# Patient Record
Sex: Female | Born: 1965
Health system: Southern US, Community
[De-identification: ages and names within clinical notes are randomized; demographics above are authoritative.]

## PROBLEM LIST (undated history)

## (undated) DIAGNOSIS — I10 Essential (primary) hypertension: Secondary | ICD-10-CM

## (undated) DIAGNOSIS — D649 Anemia, unspecified: Secondary | ICD-10-CM

## (undated) DIAGNOSIS — D219 Benign neoplasm of connective and other soft tissue, unspecified: Secondary | ICD-10-CM

## (undated) DIAGNOSIS — E785 Hyperlipidemia, unspecified: Secondary | ICD-10-CM

## (undated) DIAGNOSIS — N83209 Unspecified ovarian cyst, unspecified side: Secondary | ICD-10-CM

## (undated) HISTORY — PX: PELVIC LAPAROSCOPY: SHX162

## (undated) HISTORY — PX: OVARIAN CYST SURGERY: SHX726

## (undated) HISTORY — DX: Benign neoplasm of connective and other soft tissue, unspecified: D21.9

## (undated) HISTORY — DX: Anemia, unspecified: D64.9

## (undated) HISTORY — DX: Hyperlipidemia, unspecified: E78.5

## (undated) HISTORY — PX: UTERINE FIBROID SURGERY: SHX826

## (undated) HISTORY — DX: Essential (primary) hypertension: I10

## (undated) HISTORY — PX: TOOTH EXTRACTION: SUR596

## (undated) HISTORY — PX: MYOMECTOMY: SHX85

## (undated) HISTORY — DX: Unspecified ovarian cyst, unspecified side: N83.209

---

## 2000-12-01 ENCOUNTER — Other Ambulatory Visit: Admission: RE | Admit: 2000-12-01 | Discharge: 2000-12-01 | Payer: Self-pay | Admitting: Obstetrics & Gynecology

## 2001-12-23 ENCOUNTER — Other Ambulatory Visit: Admission: RE | Admit: 2001-12-23 | Discharge: 2001-12-23 | Payer: Self-pay | Admitting: Obstetrics & Gynecology

## 2002-05-13 ENCOUNTER — Encounter (INDEPENDENT_AMBULATORY_CARE_PROVIDER_SITE_OTHER): Payer: Self-pay

## 2002-05-13 ENCOUNTER — Ambulatory Visit (HOSPITAL_COMMUNITY): Admission: RE | Admit: 2002-05-13 | Discharge: 2002-05-13 | Payer: Self-pay | Admitting: Obstetrics & Gynecology

## 2003-01-02 ENCOUNTER — Other Ambulatory Visit: Admission: RE | Admit: 2003-01-02 | Discharge: 2003-01-02 | Payer: Self-pay | Admitting: *Deleted

## 2003-05-31 ENCOUNTER — Encounter: Admission: RE | Admit: 2003-05-31 | Discharge: 2003-05-31 | Payer: Self-pay | Admitting: Family Medicine

## 2004-02-06 ENCOUNTER — Inpatient Hospital Stay (HOSPITAL_COMMUNITY): Admission: RE | Admit: 2004-02-06 | Discharge: 2004-02-08 | Payer: Self-pay | Admitting: Obstetrics and Gynecology

## 2004-02-06 ENCOUNTER — Encounter (INDEPENDENT_AMBULATORY_CARE_PROVIDER_SITE_OTHER): Payer: Self-pay | Admitting: *Deleted

## 2004-03-25 ENCOUNTER — Other Ambulatory Visit: Admission: RE | Admit: 2004-03-25 | Discharge: 2004-03-25 | Payer: Self-pay | Admitting: Obstetrics and Gynecology

## 2006-02-02 ENCOUNTER — Other Ambulatory Visit: Admission: RE | Admit: 2006-02-02 | Discharge: 2006-02-02 | Payer: Self-pay | Admitting: Obstetrics and Gynecology

## 2006-03-24 ENCOUNTER — Ambulatory Visit (HOSPITAL_COMMUNITY): Admission: RE | Admit: 2006-03-24 | Discharge: 2006-03-24 | Payer: Self-pay | Admitting: Obstetrics and Gynecology

## 2007-03-29 ENCOUNTER — Ambulatory Visit (HOSPITAL_COMMUNITY): Admission: RE | Admit: 2007-03-29 | Discharge: 2007-03-29 | Payer: Self-pay | Admitting: Obstetrics and Gynecology

## 2007-04-07 ENCOUNTER — Encounter: Admission: RE | Admit: 2007-04-07 | Discharge: 2007-04-07 | Payer: Self-pay | Admitting: Obstetrics and Gynecology

## 2008-03-29 ENCOUNTER — Ambulatory Visit (HOSPITAL_COMMUNITY): Admission: RE | Admit: 2008-03-29 | Discharge: 2008-03-29 | Payer: Self-pay | Admitting: Obstetrics and Gynecology

## 2008-11-22 ENCOUNTER — Ambulatory Visit: Payer: Self-pay | Admitting: Obstetrics and Gynecology

## 2008-11-22 ENCOUNTER — Other Ambulatory Visit: Admission: RE | Admit: 2008-11-22 | Discharge: 2008-11-22 | Payer: Self-pay | Admitting: Obstetrics and Gynecology

## 2008-11-22 ENCOUNTER — Encounter: Payer: Self-pay | Admitting: Obstetrics and Gynecology

## 2008-12-25 ENCOUNTER — Ambulatory Visit: Payer: Self-pay | Admitting: Obstetrics and Gynecology

## 2009-01-05 ENCOUNTER — Ambulatory Visit: Payer: Self-pay | Admitting: Obstetrics and Gynecology

## 2009-04-12 ENCOUNTER — Ambulatory Visit (HOSPITAL_COMMUNITY): Admission: RE | Admit: 2009-04-12 | Discharge: 2009-04-12 | Payer: Self-pay | Admitting: Obstetrics and Gynecology

## 2010-01-08 ENCOUNTER — Other Ambulatory Visit: Admission: RE | Admit: 2010-01-08 | Discharge: 2010-01-08 | Payer: Self-pay | Admitting: Obstetrics and Gynecology

## 2010-01-08 ENCOUNTER — Ambulatory Visit: Payer: Self-pay | Admitting: Obstetrics and Gynecology

## 2010-03-07 ENCOUNTER — Other Ambulatory Visit: Payer: Self-pay | Admitting: Obstetrics and Gynecology

## 2010-03-07 DIAGNOSIS — Z1231 Encounter for screening mammogram for malignant neoplasm of breast: Secondary | ICD-10-CM

## 2010-03-07 DIAGNOSIS — Z0000 Encounter for general adult medical examination without abnormal findings: Secondary | ICD-10-CM

## 2010-03-09 ENCOUNTER — Encounter: Payer: Self-pay | Admitting: Obstetrics and Gynecology

## 2010-03-10 ENCOUNTER — Encounter: Payer: Self-pay | Admitting: Obstetrics and Gynecology

## 2010-04-15 ENCOUNTER — Ambulatory Visit (HOSPITAL_COMMUNITY)
Admission: RE | Admit: 2010-04-15 | Discharge: 2010-04-15 | Disposition: A | Discharge status: Home / Self Care | Payer: 59 | Source: Ambulatory Visit | Attending: Obstetrics and Gynecology | Admitting: Obstetrics and Gynecology

## 2010-04-15 ENCOUNTER — Encounter (HOSPITAL_COMMUNITY): Payer: Self-pay

## 2010-04-15 DIAGNOSIS — Z1231 Encounter for screening mammogram for malignant neoplasm of breast: Secondary | ICD-10-CM | POA: Insufficient documentation

## 2010-07-05 NOTE — Discharge Summary (Signed)
Andrea Lynn, Andrea Lynn             ACCOUNT NO.:  [id number]   MEDICAL RECORD NO.:  [id number]          PATIENT TYPE:  INP   LOCATION:  9309                          [facility]ILITY:  [facility]   PHYSICIAN:  Daniel L. Gottsegen, M.D.DATE OF BIRTH:  02/22/1968   DATE OF ADMISSION:  02/06/2004  DATE OF DISCHARGE:  02/08/2004                                 DISCHARGE SUMMARY   The patient is a 45 year old female who was admitted to the hospital with  large fibroids and anemia for definitive surgery.  On the day of admission,  she was taken to the operating room.  Multiple myomectomies and a left  ovarian cyst were   Dictation ended at this point.      DLG/MEDQ  D:  02/27/2004  T:  02/27/2004  Job:  [id number]

## 2010-07-05 NOTE — Discharge Summary (Signed)
Andrea Lynn, Andrea Lynn             ACCOUNT NO.:  [id number]   MEDICAL RECORD NO.:  [id number]          PATIENT TYPE:  INP   LOCATION:  9309                          [facility]   PHYSICIAN:  Daniel L. Gottsegen, M.D.DATE OF BIRTH:  02/22/1968   DATE OF ADMISSION:  02/06/2004  DATE OF DISCHARGE:  02/08/2004                                 DISCHARGE SUMMARY   REDICTATION   The patient is a 45 year old female who was admitted to the hospital with  large fibroids, DUB, anemia for definitive surgery.  On the day of  admission, she was taken to the operating room, multiple myomectomies were  performed as well as a left ovarian cystectomy.  Significant blood loss  occurred of about 1250 mL but postoperatively the patient seemed to be  tolerating it well.  Her hemoglobin dropped to 7.5 but once again she could  ambulate without difficulty.  She had a mild ileus postoperatively but by  the second postoperative day had responded to a Dulcolax suppository. She  was discharged on iron and will return to the office for followup care in  two weeks.  Final pathology report revealed multiple fibroids and benign  left ovarian cyst.   CONDITION ON DISCHARGE:  Improved.   DISCHARGE DIAGNOSES:  Multiple fibroids with anemia and dysfunctional  uterine bleeding, left ovarian cyst.   OPERATION:  Multiple myomectomies, left ovarian cystectomy.     Dani   DLG/MEDQ  D:  03/08/2004  T:  03/08/2004  Job:  [id number]

## 2010-07-05 NOTE — Op Note (Signed)
   NAME:  Andrea Lynn, Andrea Lynn                       ACCOUNT NO.:  [id number]   MEDICAL RECORD NO.:  [id number]                   PATIENT TYPE:  AMB   LOCATION:  SDC                                  [facility]   PHYSICIAN:  Freddy Finner, M.D.                DATE OF BIRTH:  [birth year]   DATE OF PROCEDURE:  05/13/2002  DATE OF DISCHARGE:                                 OPERATIVE REPORT   PREOPERATIVE DIAGNOSIS:  Submucous myoma.   POSTOPERATIVE DIAGNOSIS:  Submucous myoma.   OPERATIVE PROCEDURES:  1. Hysteroscopy.  2. Resection of submucous myoma.  3. Endometrial curettage.   ANESTHESIA:  General.   ESTIMATED INTRAOPERATIVE BLOOD LOSS:  Less than or equal to 20 mL.   SORBITOL DEFICIT:  20 mL.   INTRAOPERATIVE COMPLICATIONS:  None.   INDICATIONS:  The patient is a 45 year old who had dysfunctional uterine  bleeding.  She was found by sonohysterogram to have a submucous myoma.  She  is admitted now for hysterectomy and D&C.   DESCRIPTION OF PROCEDURE:  She was admitted on the morning of surgery,  brought to the operating room, placed under appropriate for gestational age,  and placed in the dorsolithotomy position using the [employer] system.  Betadine prep of the perineum and vagina was carried out in the usual  fashion.  The cervix and uterus sounded to 10 cm.  The cervix was  progressively dilated to 23 with Pratts.  The HEMI 12.5 degree hysteroscope  was introduced using 3% Sorbitol as the distended median and inspection  identified the submucous myoma.  The cervix was then further dilated to 35  with Pratts.  Ring forceps were introduced and grasped and the myoma  resected without difficulty.  Gentle thorough curettage was then done and a  separate sample submitted.  Reinspection confirmed absence of the myoma.  The procedure was terminated.  The instruments were removed.  The patient  was awaken and taken to recovery in good condition.  She will be discharged  in  the immediate postoperative period for follow-up in the office in  approximately 7-10 days.                                               Freddy Finner, M.D.    WRN/MEDQ  D:  05/13/2002  T:  05/13/2002  Job:  [id number]

## 2010-07-05 NOTE — Op Note (Signed)
Andrea Lynn, Andrea Lynn             ACCOUNT NO.:  [id number]   MEDICAL RECORD NO.:  [id number]          PATIENT TYPE:  INP   LOCATION:  9399                          [facility]   PHYSICIAN:  Daniel L. Gottsegen, M.D.DATE OF BIRTH:  02/22/1968   DATE OF PROCEDURE:  02/06/2004  DATE OF DISCHARGE:                                 OPERATIVE REPORT   PREOPERATIVE DIAGNOSIS:  Multiple fibroid with dysfunctional uterine  bleeding and anemia.   POSTOPERATIVE DIAGNOSES:  1.  Multiple fibroid with dysfunctional uterine bleeding and anemia.  2.  Left ovarian cyst.  3.  Pelvic adhesions.   OPERATION:  1.  Multiple myomectomies.  2.  Left ovarian cystectomy.  3.  Lysis of adhesions.   SURGEON:  Daniel L. Eda Paschal, M.D.   FIRST ASSISTANT:  Juan H. Lily Peer, M.D.   FINDINGS:  At the time of surgery, the patient had eight myomas.  They were  of varying size, anywhere from 1 cm all the way up to 6 cm.  Several of the  big ones were close to the endometrial cavity, one was encroaching on it,  and one actually had entered the endometrial cavity although it was not  confined to the endometrial cavity.  Both fallopian tubes had luxuriant  fimbriae.  The left fallopian tube was normal.  The right had some adhesive  disease.  The right ovary was densely buried in adhesions and could not  actually even be seen.  The left ovary was enlarged by an ovarian cyst of  about 3 cm that appeared to be a functional cyst once it had been excised.   PROCEDURE:  After adequate general endotracheal anesthesia, the patient was  placed in the supine position, prepped and draped in the usual sterile  manner.  A Foley catheter was inserted into the bladder.  A pediatric Foley  was inserted into the uterus.  A Pfannenstiel incision was made.  The fascia  was opened transversely.  The peritoneum was entered vertically.  Subcutaneous bleeders were clamped and bovied as encountered.  When the  peritoneal cavity  was opened, the above findings were noted.  A dilute  solution of Pitressin was injected in the serosa of the uterus.  A midline  vertical incision was made in the uterus, through which all myomectomies  were performed.  First a very large one was removed from the anterior wall,  then a second large one was removed from the anterior wall.  During removing  this, the endometrial cavity was entered.  At least half of the myoma was in  the endometrial cavity.  The rest of the myomas were removed without  difficulty.  Bleeding was brisk, and blood loss was felt to be over 1000 mL,  but the patient was tolerating this well.  Dye could not be injected at the  termination of the procedure because the endometrial cavity was entered so  early in the procedure.  Once all myomas were removed, the endometrial  cavity was closed with 4-0 Vicryl.  The myometrium was closed with  interrupteds of 0 Vicryl and the serosa of the  uterus was closed with 2-0  Prolene.  Attention was next turned to the adnexa.  The right tube was  somewhat kinked by adhesions.  Each of these were lysed, making the tube  have a more normal course.  Because of the brisk blood that had been lost  before, it was not felt appropriate to try to uncover the right ovary.  It  would have taken significant dissection to do so.  Both surgeons felt that  there was a possibility to even lose the ovary and in light of all the blood  loss already was not felt to be appropriate at this point.  On the left  side, however, adhesions were lysed.  The left ovarian cyst was excised and  sent to pathology for tissue diagnosis.  The ovary was closed with a running  3-0 Prolene and the patient appeared to have a fairly normal left adnexa.  At the termination of the procedure, there was no bleeding noted.  Copious  irrigation was done with Ringer's lactate.  All fluid was removed.  Interceed was placed around the left adnexa and over the uterine  incisional  site.  The peritoneum was closed with a running 0 Vicryl.  The subfascial  and superficial spaces were copiously irrigated with sterile saline.  The  fascia was closed with two running 0 Vicryl and the skin was closed with 3-0  plain subcuticular suture and Steri-Strips.  Estimated blood loss was 1250  mL with none replaced.  Hemoglobin done at the termination of the procedure  was 7.9.  The patient appeared to tolerate the procedure well and left the  operating room in satisfactory condition draining clear urine from her Foley  catheter.     Dani   DLG/MEDQ  D:  02/06/2004  T:  02/06/2004  Job:  [id number]

## 2010-07-05 NOTE — H&P (Signed)
NAMEJERRELL, Andrea Lynn             ACCOUNT NO.:  [id number]   MEDICAL RECORD NO.:  [id number]          PATIENT TYPE:  INP   LOCATION:  NA                            [facility]   PHYSICIAN:  Daniel L. Gottsegen, M.D.DATE OF BIRTH:  23-Dec-1965   DATE OF ADMISSION:  DATE OF DISCHARGE:                                HISTORY & PHYSICAL   CHIEF COMPLAINT:  Symptomatic fibroids.   HISTORY OF PRESENT ILLNESS:  The patient is a 45 year old gravida 1, para 0,  Ab1, who has had persistent menometrorrhagia.  Ultrasound reveals multiple  large fibroids; the largest one is intramural and encroaches on the  endometrial cavity and it is 5.5 cm.  She also has a 3.5-cm one, a 2.5-cm  one, a 2.0-cm one and 2 less than 2.0 cm.  The patient is interested in  conceiving and therefore would like to proceed with conservative surgery;  she therefore enters the hospital for myomectomy for treatment of the above.  She understands that there is some small but real risk of hysterectomy in  doing a myomectomy.   PAST MEDICAL HISTORY:  The patient had an exploratory laparotomy done by Dr.  Tinnie Gens L. Deaton at [facility] because of concern that she had pelvic  pathology, however, no pelvic pathology was found; this was in 1998.  She  also had a hysteroscopic myomectomy done in 2004 by Dr. Lacretia Nicks. Varney Baas for a  submucous myoma.   PRESENT MEDICATIONS:  Iron.   ALLERGIES:  She is allergic to no drugs.   FAMILY HISTORY:  Family history is completely noncontributory.   SOCIAL HISTORY:  She is a nonsmoker, nondrinker.  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   REVIEW OF SYSTEMS:  HEENT:  Negative.  CARDIAC:  Negative.  RESPIRATORY:  Negative.  GI:  Negative.  GU:  Negative.  NEUROMUSCULAR:  Negative.  ENDOCRINE:  Negative.   PHYSICAL EXAMINATION:  GENERAL:  The patient is a well-developed, well-  nourished female in no acute distress.  VITAL SIGNS:  Blood pressure is 140/85.  Pulse is 80 and  regular.  Respirations are 16 and nonlabored.  She is afebrile.  HEENT:  Within normal limits.  NECK:  Neck is supple.  Trachea in the midline.  Thyroid not enlarged.  LUNGS:  Lungs are clear to P&A.  HEART:  No thrills, heaves or murmurs.  BREASTS:  No masses.  ABDOMEN:  Abdomen is soft without guarding, rebound or masses.  PELVIC:  External and vaginal are normal.  Cervix is clear.  Pap smear shows  no atypia.  Uterus is enlarged by multiple myomas to 10 weeks' size.  Adnexa  are palpably normal.  RECTAL:  Rectal is confirmatory.  EXTREMITIES:  Extremities are within normal limits.   ADMISSION IMPRESSION:  Multiple fibroids with menometrorrhagia.   PLAN:  Multiple myomectomies.     Dani   DLG/MEDQ  D:  02/05/2004  T:  02/05/2004  Job:  [id number]

## 2011-03-13 ENCOUNTER — Other Ambulatory Visit: Payer: Self-pay | Admitting: Obstetrics and Gynecology

## 2011-03-13 DIAGNOSIS — Z1231 Encounter for screening mammogram for malignant neoplasm of breast: Secondary | ICD-10-CM

## 2011-04-17 ENCOUNTER — Encounter: Payer: Self-pay | Admitting: Gynecology

## 2011-04-17 ENCOUNTER — Ambulatory Visit (HOSPITAL_COMMUNITY)
Admission: RE | Admit: 2011-04-17 | Discharge: 2011-04-17 | Disposition: A | Discharge status: Home / Self Care | Payer: 59 | Source: Ambulatory Visit | Attending: Obstetrics and Gynecology | Admitting: Obstetrics and Gynecology

## 2011-04-17 DIAGNOSIS — N83209 Unspecified ovarian cyst, unspecified side: Secondary | ICD-10-CM | POA: Insufficient documentation

## 2011-04-17 DIAGNOSIS — D219 Benign neoplasm of connective and other soft tissue, unspecified: Secondary | ICD-10-CM | POA: Insufficient documentation

## 2011-04-17 DIAGNOSIS — Z1231 Encounter for screening mammogram for malignant neoplasm of breast: Secondary | ICD-10-CM | POA: Insufficient documentation

## 2011-04-24 ENCOUNTER — Encounter: Payer: 59 | Admitting: Obstetrics and Gynecology

## 2011-04-25 ENCOUNTER — Other Ambulatory Visit (HOSPITAL_COMMUNITY)
Admission: RE | Admit: 2011-04-25 | Discharge: 2011-04-25 | Disposition: A | Discharge status: Home / Self Care | Payer: 59 | Source: Ambulatory Visit | Attending: Obstetrics and Gynecology | Admitting: Obstetrics and Gynecology

## 2011-04-25 ENCOUNTER — Ambulatory Visit (INDEPENDENT_AMBULATORY_CARE_PROVIDER_SITE_OTHER): Payer: 59 | Admitting: Obstetrics and Gynecology

## 2011-04-25 ENCOUNTER — Encounter: Payer: Self-pay | Admitting: Obstetrics and Gynecology

## 2011-04-25 VITALS — BP 140/84 | Ht 66.25 in | Wt 208.0 lb

## 2011-04-25 DIAGNOSIS — Z01419 Encounter for gynecological examination (general) (routine) without abnormal findings: Secondary | ICD-10-CM | POA: Insufficient documentation

## 2011-04-25 NOTE — Progress Notes (Signed)
Patient came to see me today for her annual GYN exam. She has stopped her HRT. She still has occasional hot flashes but she feels she's fine without it. She just a normal mammogram. She does her bone densities and her lab to her PCP. 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 She is having some trouble with weight gain after menopause and will work on diet and exercise. She gets frequent bilateral leg cramps especially at night. She will make sure her potassium was normal when it was checked by her PCP. She is having no vaginal bleeding or pelvic pain.  Physical examination: Andrea Lynn present. HEENT within normal limits. Neck: Thyroid not large. No masses. Supraclavicular nodes: not enlarged. Breasts: Examined in both sitting midline position. No skin changes and no masses. Abdomen: Soft no guarding rebound or masses or hernia. Pelvic: External: Within normal limits. BUS: Within normal limits. Vaginal:within normal limits. Good estrogen effect. No evidence of cystocele rectocele or enterocele. Cervix: clean. Uterus: Normal size and shape. Adnexa: No masses. Rectovaginal exam: Confirmatory and negative. Extremities: Within normal limits. No swelling, varicosities or evidence of DVT.  Assessment: Normal GYN exam  Plan: Continue yearly mammograms. Patient to discuss leg  cramps with her PCP.

## 2012-03-18 ENCOUNTER — Other Ambulatory Visit: Payer: Self-pay | Admitting: Pulmonary Disease

## 2012-03-18 DIAGNOSIS — Z1231 Encounter for screening mammogram for malignant neoplasm of breast: Secondary | ICD-10-CM

## 2012-04-19 ENCOUNTER — Ambulatory Visit (HOSPITAL_COMMUNITY)
Admission: RE | Admit: 2012-04-19 | Discharge: 2012-04-19 | Disposition: A | Discharge status: Home / Self Care | Payer: 59 | Source: Ambulatory Visit | Attending: Pulmonary Disease | Admitting: Pulmonary Disease

## 2012-04-19 DIAGNOSIS — Z1231 Encounter for screening mammogram for malignant neoplasm of breast: Secondary | ICD-10-CM | POA: Insufficient documentation

## 2012-04-26 ENCOUNTER — Encounter: Payer: 59 | Admitting: Women's Health

## 2012-05-03 ENCOUNTER — Encounter: Payer: Self-pay | Admitting: Women's Health

## 2012-05-03 ENCOUNTER — Ambulatory Visit: Payer: 59 | Admitting: Women's Health

## 2012-05-03 VITALS — BP 116/76 | Ht 66.25 in | Wt 198.0 lb

## 2012-05-03 DIAGNOSIS — Z01419 Encounter for gynecological examination (general) (routine) without abnormal findings: Secondary | ICD-10-CM

## 2012-05-03 NOTE — Progress Notes (Signed)
Andrea Lynn 02/22/1968 [id number]    History:    The patient presents for annual exam.  Postmenopausal on no HRT/no bleeding. History of normal Paps and mammograms. Primary care labs. History of infertility, failed IVF. Myomectomy. Dexa at [facility].   Past medical history, past surgical history, family history and social history were all reviewed and documented in the EPIC chart. [place]. Trying to adopt sisters 2 youngest children from [place]. Works in NICU.   ROS:  A  ROS was performed and pertinent positives and negatives are included in the history.  Exam:  Filed Vitals:   05/03/12 1100  BP: 116/76    General appearance:  Normal Head/Neck:  Normal, without cervical or supraclavicular adenopathy. Thyroid:  Symmetrical, normal in size, without palpable masses or nodularity. Respiratory  Effort:  Normal  Auscultation:  Clear without wheezing or rhonchi Cardiovascular  Auscultation:  Regular rate, without rubs, murmurs or gallops  Edema/varicosities:  Not grossly evident Abdominal  Soft,nontender, without masses, guarding or rebound.  Liver/spleen:  No organomegaly noted  Hernia:  None appreciated  Skin  Inspection:  Grossly normal  Palpation:  Grossly normal Neurologic/psychiatric  Orientation:  Normal with appropriate conversation.  Mood/affect:  Normal  Genitourinary    Breasts: Examined lying and sitting.     Right: Without masses, retractions, discharge or axillary adenopathy.     Left: Without masses, retractions, discharge or axillary adenopathy.   Inguinal/mons:  Normal without inguinal adenopathy  External genitalia: female circumcision  BUS/Urethra/Skene's glands:  Normal  Bladder:  Normal  Vagina:  Normal  Cervix:  Normal  Uterus:  Fibroid,  shape and contour.  Midline and mobile  Adnexa/parametria:     Rt: Without masses or tenderness.   Lt: Without masses or tenderness.  Anus and perineum: Normal  Digital rectal exam: Normal  sphincter tone without palpated masses or tenderness  Assessment/Plan:  47 y.o. MBF G0 for annual exam.    Normal postmenopausal exam/no HRT/no bleeding Fibroid uterus  Plan: SBE's, continue annual mammogram, calcium rich diet, vitamin D 2000 daily encouraged. Labs at primary care. Normal Pap 04/2011. New screening guidelines reviewed. (Requested letter written in regards to adopting 2 nieces from [place].)    Harrington Challenger [facility], 11:35 AM 05/03/2012

## 2012-05-03 NOTE — Patient Instructions (Addendum)

## 2012-06-02 ENCOUNTER — Encounter: Payer: Self-pay | Admitting: Obstetrics and Gynecology

## 2013-03-16 ENCOUNTER — Other Ambulatory Visit (HOSPITAL_COMMUNITY): Payer: Self-pay | Admitting: Internal Medicine

## 2013-03-16 DIAGNOSIS — Z1231 Encounter for screening mammogram for malignant neoplasm of breast: Secondary | ICD-10-CM

## 2013-04-13 ENCOUNTER — Emergency Department (HOSPITAL_COMMUNITY)
Admission: EM | Admit: 2013-04-13 | Discharge: 2013-04-13 | Disposition: A | Discharge status: Home / Self Care | Payer: 59 | Attending: Emergency Medicine | Admitting: Emergency Medicine

## 2013-04-13 ENCOUNTER — Encounter (HOSPITAL_COMMUNITY): Payer: Self-pay | Admitting: Emergency Medicine

## 2013-04-13 DIAGNOSIS — Z79899 Other long term (current) drug therapy: Secondary | ICD-10-CM | POA: Insufficient documentation

## 2013-04-13 DIAGNOSIS — Y92410 Unspecified street and highway as the place of occurrence of the external cause: Secondary | ICD-10-CM | POA: Insufficient documentation

## 2013-04-13 DIAGNOSIS — Z8742 Personal history of other diseases of the female genital tract: Secondary | ICD-10-CM | POA: Insufficient documentation

## 2013-04-13 DIAGNOSIS — Y9389 Activity, other specified: Secondary | ICD-10-CM | POA: Insufficient documentation

## 2013-04-13 DIAGNOSIS — S20219A Contusion of unspecified front wall of thorax, initial encounter: Secondary | ICD-10-CM | POA: Insufficient documentation

## 2013-04-13 MED ORDER — IBUPROFEN 200 MG PO TABS
600.0000 mg | ORAL_TABLET | Freq: Once | ORAL | Status: AC
  Administered 2013-04-13: 600 mg via ORAL
  Filled 2013-04-13 (×2): qty 1
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
## 2013-04-14 ENCOUNTER — Encounter: Payer: Self-pay | Admitting: Women's Health
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
## 2013-04-21 ENCOUNTER — Ambulatory Visit (HOSPITAL_COMMUNITY): Payer: 59

## 2013-05-04 ENCOUNTER — Encounter: Payer: 59 | Admitting: Women's Health

## 2013-06-01 ENCOUNTER — Ambulatory Visit (HOSPITAL_COMMUNITY)
Admission: RE | Admit: 2013-06-01 | Discharge: 2013-06-01 | Disposition: A | Discharge status: Home / Self Care | Payer: 59 | Source: Ambulatory Visit | Attending: Internal Medicine | Admitting: Internal Medicine

## 2013-06-01 DIAGNOSIS — Z1231 Encounter for screening mammogram for malignant neoplasm of breast: Secondary | ICD-10-CM | POA: Insufficient documentation

## 2013-06-23 ENCOUNTER — Encounter: Payer: Self-pay | Admitting: Women's Health

## 2013-06-23 ENCOUNTER — Ambulatory Visit (INDEPENDENT_AMBULATORY_CARE_PROVIDER_SITE_OTHER): Payer: 59 | Admitting: Women's Health

## 2013-06-23 VITALS — BP 128/84 | Ht 66.0 in | Wt 208.0 lb

## 2013-06-23 DIAGNOSIS — Z01419 Encounter for gynecological examination (general) (routine) without abnormal findings: Secondary | ICD-10-CM

## 2013-06-23 NOTE — Progress Notes (Signed)
Andrea Lynn 02/22/1968 [id number]    History:    Presents for annual exam.  Postmenopausal/no HRT/no bleeding. Normal Pap and mammogram history. Infertility. failed IVF. Fibroid uterus, several 1-2 cm each. Labs and DEXA/normal at primary care.  Past medical history, past surgical history, family history and social history were all reviewed and documented in the EPIC chart. Works in Therapist, music. Numerous family members with hypertension. Parents and family in [place].  ROS:  A  12 point ROS was performed and pertinent positives and negatives are included.  Exam:  Filed Vitals:   06/23/13 1558  BP: 128/84    General appearance:  Normal Thyroid:  Symmetrical, normal in size, without palpable masses or nodularity. Respiratory  Auscultation:  Clear without wheezing or rhonchi Cardiovascular  Auscultation:  Regular rate, without rubs, murmurs or gallops  Edema/varicosities:  Not grossly evident Abdominal  Soft,nontender, without masses, guarding or rebound.  Liver/spleen:  No organomegaly noted  Hernia:  None appreciated  Skin  Inspection:  Grossly normal   Breasts: Examined lying and sitting.     Right: Without masses, retractions, discharge or axillary adenopathy.     Left: Without masses, retractions, discharge or axillary adenopathy. Gentitourinary   Inguinal/mons:  Normal without inguinal adenopathy  External genitalia:  Female circumcision  BUS/Urethra/Skene's glands:  Normal  Vagina:  Normal  Cervix:  Normal  Uterus:   normal in size, shape and contour.  Midline and mobile  Adnexa/parametria:     Rt: Without masses or tenderness.   Lt: Without masses or tenderness.  Anus and perineum: Normal  Digital rectal exam: Normal sphincter tone without palpated masses or tenderness  Assessment/Plan:  48 y.o.MBF G0  for annual exam.   Postmenopausal/no HRT/no bleeding Labs primary care Obesity  Plan: SBE's, continue annual mammogram, calcium rich diet, vitamin D 2000 daily,  decrease calories for weight loss encouraged. Pap normal 2013, new screening guidelines reviewed.    [facility] [place], 4:30 PM 06/23/2013

## 2013-12-02 ENCOUNTER — Other Ambulatory Visit: Payer: Self-pay

## 2013-12-19 ENCOUNTER — Encounter: Payer: Self-pay | Admitting: Women's Health

## 2013-12-27 ENCOUNTER — Encounter: Payer: Self-pay | Admitting: Sports Medicine

## 2013-12-27 ENCOUNTER — Ambulatory Visit (INDEPENDENT_AMBULATORY_CARE_PROVIDER_SITE_OTHER): Payer: 59 | Admitting: Sports Medicine

## 2013-12-27 VITALS — BP 151/98 | Ht 67.0 in | Wt 192.0 lb

## 2013-12-27 DIAGNOSIS — M6248 Contracture of muscle, other site: Secondary | ICD-10-CM

## 2013-12-27 DIAGNOSIS — G2589 Other specified extrapyramidal and movement disorders: Secondary | ICD-10-CM

## 2013-12-27 DIAGNOSIS — M62838 Other muscle spasm: Secondary | ICD-10-CM

## 2013-12-27 MED ORDER — CYCLOBENZAPRINE HCL 5 MG PO TABS: 5.0000 mg | ORAL_TABLET | Freq: Three times a day (TID) | ORAL | Status: DC | PRN

## 2013-12-27 NOTE — Progress Notes (Signed)
   Subjective:    Patient ID: Andrea Lynn, female    DOB: Apr 21, 1965, 48 y.o.   MRN: [id number]  HPI Andrea Lynn is a 48 year old right-hand-dominant female who presents with left shoulder pain. Onset was about 6 months ago without any known acute injury. She works as a English as a second language teacher. Location of pain is primarily medial left scapular with some radiation throughout the scapula. She denies any neck pain or radiation of pain down her arms. Pulling items aggravates her symptoms. She tried topical Voltaren, which helps some. She denies any associated weakness, numbness, or tingling. Character of pain is a pulling sensation.  Past medical history, social history, medications, and allergies were reviewed and are up to date in the chart.  Review of Systems 7 point review of systems was performed and was otherwise negative unless noted in the history of present illness.     Objective:   Physical Exam BP 151/98 mmHg  Ht 5\' 7"  (1.702 m)  Wt 192 lb (87.091 kg)  BMI 30.06 kg/m2  LMP 04/07/2010 GEN: The patient is well-developed well-nourished female and in no acute distress.  She is awake alert and oriented x3. SKIN: warm and well-perfused, no rash  Neuro: Strength 5/5 globally. Sensation intact throughout. DTRs 2/4 bilaterally. No focal deficits. Vasc: +2 bilateral distal pulses. No edema.  MSK: examination of the left shoulder reveals full pain-free range of motion in flexion and extension. She has intact rotator cuff strength and no impingement signs. No a.c. Joint tenderness. She has tenderness to palpation over the medial left scapular rhomboid muscles. She has some mild scapular winging with arm abduction. I do feel mild pop as she abducts her arm over her scapula. No overlying erythema or induration.     Assessment & Plan:  Please see problem based assessment and plan in the problem list.

## 2013-12-27 NOTE — Patient Instructions (Signed)
-  Follow-up in 6 weeks if needed

## 2013-12-27 NOTE — Assessment & Plan Note (Signed)
-  Rx cyclobenzaprine with precautions -Heating pad, exercises, stretching -Rx formal PT for scapular exercises -Follow-up if needed

## 2013-12-29 ENCOUNTER — Telehealth: Payer: Self-pay | Admitting: *Deleted

## 2013-12-29 NOTE — Telephone Encounter (Signed)
Pt called to schedule her eval she requested an late appt. gve appt for 11.30.2015 @ 3:45p. Pt is aware that i will mail forms for her appt...td

## 2014-01-16 ENCOUNTER — Telehealth: Payer: Self-pay | Admitting: *Deleted

## 2014-01-16 ENCOUNTER — Ambulatory Visit: Payer: 59 | Attending: Sports Medicine | Admitting: Physical Therapy

## 2014-01-16 DIAGNOSIS — G2589 Other specified extrapyramidal and movement disorders: Secondary | ICD-10-CM

## 2014-01-16 DIAGNOSIS — M62838 Other muscle spasm: Secondary | ICD-10-CM | POA: Insufficient documentation

## 2014-01-16 DIAGNOSIS — G8929 Other chronic pain: Secondary | ICD-10-CM | POA: Insufficient documentation

## 2014-01-16 DIAGNOSIS — M25512 Pain in left shoulder: Secondary | ICD-10-CM | POA: Diagnosis not present

## 2014-01-16 DIAGNOSIS — G249 Dystonia, unspecified: Secondary | ICD-10-CM | POA: Insufficient documentation

## 2014-01-16 NOTE — Telephone Encounter (Signed)
appts made and printed...td 

## 2014-01-16 NOTE — Patient Instructions (Signed)
Lateral Shoulder Stretch   Bend left arm so elbow points straight ahead. Hold elbow with other hand and slowly pull toward body. A slight stretch should be felt behind shoulder and into back. Hold _30___ seconds. Repeat with other arm. Repeat __3__ times. Do _2-3___ sessions per day.  http://gt2.exer.us/223   Copyright  VHI. All rights reserved.   Scapular Retraction (Standing)   With arms at sides, pinch shoulder blades together. Repeat __10__ times per set. Do _1___ sets per session. Do _1-2__ sessions per day.  http://orth.exer.us/945   Copyright  VHI. All rights reserved.   Laureen Abrahams, PT, DPT 01/16/2014 4:15 PM [address] [phone] (office) [phone] (fax)

## 2014-01-16 NOTE — Therapy (Signed)
Physical Therapy Evaluation  Patient Details  Name: Andrea Lynn MRN: [id number] Date of Birth: Dec 25, 1965  Encounter Date: 01/16/2014      PT End of Session - 01/16/14 1624    Visit Number 1   Number of Visits 6   PT Start Time 6948   PT Stop Time 1624   PT Time Calculation (min) 39 min   Activity Tolerance Patient tolerated treatment well   Behavior During Therapy [facility] for tasks assessed/performed      Past Medical History  Diagnosis Date  . Fibroid   . Ovarian cyst     Past Surgical History  Procedure Laterality Date  . Pelvic laparoscopy      EXPL. LAP  . Ovarian cyst surgery    . Tooth extraction    . Myomectomy      LMP 04/07/2010  Visit Diagnosis:  Trapezius muscle spasm - Plan: PT plan of care cert/re-cert  Scapular dyskinesis - Plan: PT plan of care cert/re-cert  Left shoulder pain - Plan: PT plan of care cert/re-cert      Subjective Assessment - 01/16/14 1549    Symptoms Pt is a 48 y/o female who presents to OPPT for L shoulder pain exacerbated with rain/cold weather.  Pt reports symptoms x 8 months   Limitations --  pulling   Patient Stated Goals improve pain with functional activities   Currently in Pain? Yes   Pain Score 7    Pain Location Shoulder   Pain Orientation Left   Pain Descriptors / Indicators Aching   Pain Type Chronic pain   Pain Onset More than a month ago   Pain Frequency Intermittent   Aggravating Factors  pulling, using UE, weather   Pain Relieving Factors medication, rest   Multiple Pain Sites No          [facility] PT Assessment - 01/16/14 1553    Assessment   Medical Diagnosis L trapezius muscle spasm; L scapular dyskinesis   Onset Date 05/16/13   Next MD Visit PRN   Prior Therapy n/a   Precautions   Precautions None   Restrictions   Weight Bearing Restrictions No   Balance Screen   Has the patient fallen in the past 6 months No   Has the patient had a decrease in activity level because of a fear of falling?   No   Is the patient reluctant to leave their home because of a fear of falling?  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   Cognition   Overall Cognitive Status Within Functional Limits for tasks assessed   Observation/Other Assessments   Observations tender to palpation along upper trap and supraspinatus muscle L shoulder; mild scapular winging on L   Focus on Therapeutic Outcomes (FOTO)  43 (57% limited)  predicted 21% improvement to 36% limited   Posture/Postural Control   Posture/Postural Control Postural limitations   Postural Limitations Rounded Shoulders;Forward head;Increased thoracic kyphosis   AROM  Overall AROM Comments L shoulder ROM WNL however pain at end range with flexion and abduction   Strength   Right Shoulder Flexion 5/5   Right Shoulder ABduction 5/5   Right Shoulder Internal Rotation 5/5   Right Shoulder External Rotation 5/5   Left Shoulder Flexion 3+/5  pain with resistance   Left Shoulder ABduction 3+/5  pain with resistance   Left Shoulder Internal Rotation 5/5   Left Shoulder External Rotation 4/5  pain with resistance   Right Elbow Flexion 5/5   Right Elbow Extension 5/5   Left Elbow Flexion 5/5   Left Elbow Extension 4/5  pain with resistance   Grip (lbs) 54.3   Grip (lbs) 41.3   Special Tests    Special Tests Rotator Cuff Impingement   Rotator Cuff Impingment tests Empty Can test   Empty Can test   Findings Positive   Side Left           [facility] Adult PT Treatment/Exercise - 01/16/14 1553    Shoulder Exercises: Seated   Retraction Strengthening;Both;10 reps   Modalities   Modalities Moist Heat   Moist Heat Therapy   Number Minutes Moist Heat 10 Minutes   Moist Heat Location Shoulder  Left   Shoulder Exercises: Stretch   Cross Chest Stretch 3 reps;30 seconds          PT Education - 01/16/14 1624    Education provided Yes   Education Details HEP   Person(s) Educated Patient   Methods Explanation;Demonstration;Verbal cues;Handout   Comprehension Verbalized understanding;Returned demonstration;Need further instruction            PT Long Term Goals - 01/16/14 1631    PT LONG TERM GOAL #1   Title independent with HEP (02/27/14)   Time 6   Period Weeks   Status New   PT LONG TERM GOAL #2   Title perform ROM pain free (02/27/14)   Time 6   Period Weeks   Status New   PT LONG TERM GOAL #3   Title verbalize understanding on posture/body mechanics to reduce risk of reinjury (02/27/14)   Time 6   Period Weeks   Status New   PT LONG TERM GOAL #4   Title report pain < 4/10 with activity (02/27/14)   Time 6   Period Weeks   Status New          Plan - 01/16/14 1628    Clinical Impression Statement Pt presents to OPPT with L shoulder pain affecting strength and functional activities.  Will benefit from PT to maximize functional mobility and decrease pain   Pt will benefit from skilled therapeutic intervention in order to improve on the following deficits Improper body mechanics;Postural dysfunction;Decreased activity tolerance;Pain;Decreased strength   Rehab Potential Good   PT Frequency 1x / week   PT Duration 6 weeks   PT Treatment/Interventions ADLs/Self Care Home Management;Moist Heat;Therapeutic activities;Patient/family education;Therapeutic exercise;Ultrasound;Manual techniques;Dry needling;Neuromuscular re-education;Cryotherapy;Electrical Stimulation;Functional mobility training   PT Next  Visit Plan review HEP; modalities as needed; ?ionto if indicated and certification order signed   Consulted and Agree with Plan of Care Patient        Problem List Patient Active Problem List   Diagnosis Date Noted  . Scapular dyskinesis 12/27/2013  . Trapezius muscle spasm 12/27/2013  . Fibroid  Laureen Abrahams, PT, DPT 01/16/2014 4:39 PM [address] [phone] (office) [phone] (fax)

## 2014-01-24 ENCOUNTER — Ambulatory Visit: Payer: 59 | Attending: Sports Medicine | Admitting: Physical Therapy

## 2014-01-24 DIAGNOSIS — G8929 Other chronic pain: Secondary | ICD-10-CM | POA: Diagnosis not present

## 2014-01-24 DIAGNOSIS — M25512 Pain in left shoulder: Secondary | ICD-10-CM | POA: Insufficient documentation

## 2014-01-24 DIAGNOSIS — G249 Dystonia, unspecified: Secondary | ICD-10-CM | POA: Insufficient documentation

## 2014-01-24 DIAGNOSIS — G2589 Other specified extrapyramidal and movement disorders: Secondary | ICD-10-CM

## 2014-01-24 DIAGNOSIS — M62838 Other muscle spasm: Secondary | ICD-10-CM | POA: Insufficient documentation

## 2014-01-24 NOTE — Patient Instructions (Signed)

## 2014-01-24 NOTE — Therapy (Signed)
Outpatient Rehabilitation [facility] [address] [place], [place], [address] Phone: [phone]   Fax:  [phone]  Physical Therapy Treatment  Patient Details  Name: Andrea Lynn MRN: [id number] Date of Birth: 04-07-1965  Encounter Date: 01/24/2014      PT End of Session - 01/24/14 1627    Visit Number 2   Number of Visits 6   Date for PT Re-Evaluation 02/27/14   PT Start Time 1544   PT Stop Time 1622   PT Time Calculation (min) 38 min   Activity Tolerance Patient tolerated treatment well   Behavior During Therapy [facility] for tasks assessed/performed      Past Medical History  Diagnosis Date  . Fibroid   . Ovarian cyst     Past Surgical History  Procedure Laterality Date  . Pelvic laparoscopy      EXPL. LAP  . Ovarian cyst surgery    . Tooth extraction    . Myomectomy      LMP 04/07/2010  Visit Diagnosis:  Trapezius muscle spasm  Scapular dyskinesis  Left shoulder pain      Subjective Assessment - 01/24/14 1547    Symptoms L shoulder feels about the same; compliant with HEP and went to [employer] 2 days last week.   Patient Stated Goals improve pain with functional activities   Currently in Pain? Yes   Pain Score 6    Pain Location Shoulder   Pain Orientation Left   Pain Descriptors / Indicators Aching   Pain Type Chronic pain   Pain Onset More than a month ago   Pain Frequency Intermittent   Aggravating Factors  pulling, using UE, weather   Pain Relieving Factors medication, rest            [facility] Adult PT Treatment/Exercise - 01/24/14 1549    Exercises   Exercises Shoulder   Shoulder Exercises: Seated   Retraction Strengthening;Both;10 reps   Modalities   Modalities Iontophoresis   Iontophoresis   Type of Iontophoresis Dexamethasone   Location L rhomboids/paraspinals   Dose 2.0 cc   Time 8 min in clinic with patch to go   Manual Therapy   Manual Therapy Massage;Myofascial release;Scapular mobilization   Massage STM to L  rhomboids/paraspinals   Myofascial Release trigger point release to L rhomboids/paraspinal   Shoulder Exercises: ROM/Strengthening   UBE (Upper Arm Bike) Level 2.0 x 5 min, 2 1/2 min forward, 2 1/2 min backwards   Shoulder Exercises: Stretch   Cross Chest Stretch 3 reps;30 seconds          PT Education - 01/24/14 1626    Education provided Yes   Education Details Iontophoresis   Person(s) Educated Patient   Methods Explanation;Handout   Comprehension Verbalized understanding            PT Long Term Goals - 01/24/14 1629    PT LONG TERM GOAL #1   Title independent with HEP (02/27/14)   Time 6   Period Weeks   Status On-going   PT LONG TERM GOAL #2   Title perform ROM pain free (02/27/14)   Time 6   Period Weeks   Status On-going   PT LONG TERM GOAL #3   Title verbalize understanding on posture/body mechanics to reduce risk of reinjury (02/27/14)   Time 6   Period Weeks   Status On-going   PT LONG TERM GOAL #4   Title report pain < 4/10 with activity (02/27/14)   Time 6   Period Weeks  Status On-going          Plan - 01/24/14 1627    Clinical Impression Statement Pt reports feeling about the same, needed min cues for correct technique for HEP.  Pt declined further exercise today.  Will continue to benefit from PT to maximize function and decrease pain.   PT Next Visit Plan modalities and manual as needed; assess Ionto results   Consulted and Agree with Plan of Care Patient     Discussed other options including dry needling; pt to consider and decide at a later date.                          Problem List Patient Active Problem List   Diagnosis Date Noted  . Scapular dyskinesis 12/27/2013  . Trapezius muscle spasm 12/27/2013  . Fibroid      Laureen Abrahams, PT, DPT 01/24/2014 4:31 PM  [facility] [address] [place] [address]  [phone] (office) [phone] (fax)

## 2014-01-31 ENCOUNTER — Ambulatory Visit: Payer: 59 | Admitting: Physical Therapy

## 2014-01-31 DIAGNOSIS — M62838 Other muscle spasm: Secondary | ICD-10-CM | POA: Diagnosis not present

## 2014-01-31 DIAGNOSIS — G2589 Other specified extrapyramidal and movement disorders: Secondary | ICD-10-CM

## 2014-01-31 DIAGNOSIS — M25512 Pain in left shoulder: Secondary | ICD-10-CM

## 2014-01-31 NOTE — Therapy (Signed)
Outpatient Rehabilitation [facility] [address] [place], [place], [address] Phone: [phone]   Fax:  [phone]  Physical Therapy Treatment  Patient Details  Name: Andrea Lynn MRN: [id number] Date of Birth: 1965/03/31  Encounter Date: 01/31/2014      PT End of Session - 01/31/14 1549    Visit Number 3   Number of Visits 6   Date for PT Re-Evaluation 02/27/14   PT Start Time 4481   PT Stop Time 1615   PT Time Calculation (min) 28 min   Activity Tolerance Patient tolerated treatment well   Behavior During Therapy [facility] for tasks assessed/performed      Past Medical History  Diagnosis Date  . Fibroid   . Ovarian cyst     Past Surgical History  Procedure Laterality Date  . Pelvic laparoscopy      EXPL. LAP  . Ovarian cyst surgery    . Tooth extraction    . Myomectomy      LMP 04/07/2010  Visit Diagnosis:  Trapezius muscle spasm  Scapular dyskinesis  Left shoulder pain      Subjective Assessment - 01/31/14 1549    Symptoms No significant pain, only took muscle relaxer once since last session   Patient Stated Goals improve pain with functional activities   Currently in Pain? No/denies            [facility] Adult PT Treatment/Exercise - 01/31/14 1551    Neck Exercises: Stretches   Chest Stretch 60 seconds;2 reps;Other (comment)  supine with towel   Neck Exercises: Theraband   Scapula Retraction Red;15 reps   Neck Exercises: Seated   Shoulder Rolls Backwards;15 reps   Iontophoresis   Type of Iontophoresis Dexamethasone   Location L rhomboids/paraspinals   Dose 2.0 cc   Time 8 min in clinic with patch to go   Manual Therapy   Manual Therapy Massage;Myofascial release;Scapular mobilization   Massage STM to L rhomboids/paraspinals   Myofascial Release trigger point release L rhomboids/decreased pain and trigger points this session   Shoulder Exercises: ROM/Strengthening   UBE (Upper Arm Bike) UBE x 5 min level 1          PT  Education - 01/31/14 1624    Education provided Yes   Education Details postural awareness and exercises   Person(s) Educated Patient   Methods Explanation;Demonstration   Comprehension Verbalized understanding            PT Long Term Goals - 01/31/14 1626    PT LONG TERM GOAL #1   Title independent with HEP (02/27/14)   Time 6   Period Weeks   Status On-going   PT LONG TERM GOAL #2   Title perform ROM pain free (02/27/14)   Time 6   Period Weeks   Status On-going   PT LONG TERM GOAL #3   Title verbalize understanding on posture/body mechanics to reduce risk of reinjury (02/27/14)   Time 6   Period Weeks   Status On-going   PT LONG TERM GOAL #4   Title report pain < 4/10 with activity (02/27/14)   Time 6   Period Weeks   Status Achieved          Plan - 01/31/14 1625    Clinical Impression Statement Pt without pain and very minimal trigger point pain this session.  If pt continues to demonstrate no pain may be ready for d/c next visit.   PT Next Visit Plan cont as indicated; ? d/c if no  pain again   Consulted and Agree with Plan of Care Patient                               Problem List Patient Active Problem List   Diagnosis Date Noted  . Scapular dyskinesis 12/27/2013  . Trapezius muscle spasm 12/27/2013  . Fibroid      Laureen Abrahams, PT, DPT 01/31/2014 4:27 PM   Outpatient Rehab [address], [place] [address]  [phone] (office) [phone] (fax)

## 2014-02-07 ENCOUNTER — Ambulatory Visit: Payer: 59 | Admitting: Physical Therapy

## 2014-02-07 DIAGNOSIS — G2589 Other specified extrapyramidal and movement disorders: Secondary | ICD-10-CM

## 2014-02-07 DIAGNOSIS — M25512 Pain in left shoulder: Secondary | ICD-10-CM

## 2014-02-07 DIAGNOSIS — M62838 Other muscle spasm: Secondary | ICD-10-CM | POA: Diagnosis not present

## 2014-02-07 NOTE — Therapy (Signed)
[facility], [place], [address] Phone: [phone]   Fax:  [phone]  Physical Therapy Treatment  Patient Details  Name: Andrea Lynn MRN: [id number] Date of Birth: [birth year]  Encounter Date: 02/07/2014      PT End of Session - 02/07/14 1614    Visit Number 4   Number of Visits 6   Date for PT Re-Evaluation 02/27/14   PT Start Time 1540   PT Stop Time 1603   PT Time Calculation (min) 23 min   Activity Tolerance Patient tolerated treatment well   Behavior During Therapy [facility] for tasks assessed/performed      Past Medical History  Diagnosis Date  . Fibroid   . Ovarian cyst     Past Surgical History  Procedure Laterality Date  . Pelvic laparoscopy      EXPL. LAP  . Ovarian cyst surgery    . Tooth extraction    . Myomectomy      LMP 04/07/2010  Visit Diagnosis:  Trapezius muscle spasm  Scapular dyskinesis  Left shoulder pain      Subjective Assessment - 02/07/14 1541    Symptoms Feels improved; has occasional pain possibly due to weather.  No pain while at work.   Currently in Pain? No/denies          [facility] PT Assessment - 02/07/14 0001    AROM   Overall AROM Comments No pain with L shoulder ROM                  [facility] Adult PT Treatment/Exercise - 02/07/14 1545    Neck Exercises: Seated   Shoulder Rolls Backwards;20 reps   Shoulder Exercises: Seated   Other Seated Exercises seated scap retraction x 15 reps   Shoulder Exercises: Standing   Retraction Theraband;15 reps;Strengthening;Both   Theraband Level (Shoulder Retraction) Level 2 (Red)   Retraction Limitations 90 degrees elbow flexion and full elbow extension x 15 reps each   Iontophoresis   Type of Iontophoresis Dexamethasone   Location L rhomboids/paraspinals   Dose 2.0 cc   Time action patch                PT Education - 02/07/14 1613    Education provided Yes   Education Details Educated  on posture and body mechanics while at home and work and correct movement of body to reduce risk of reinjury.  Added theraband to scap retraction exercises pt performing at home.   Person(s) Educated Patient   Methods Explanation;Demonstration;Handout   Comprehension Verbalized understanding;Returned demonstration             PT Long Term Goals - 02/07/14 1615    PT LONG TERM GOAL #1   Title independent with HEP (02/27/14)   Time 6   Period Weeks   Status Achieved   PT LONG TERM GOAL #2   Title perform ROM pain free (02/27/14)   Time 6   Period Weeks   Status Achieved   PT LONG TERM GOAL #3   Title verbalize understanding on posture/body mechanics to reduce risk of reinjury (02/27/14)   Time 6   Period Weeks   Status Achieved   PT LONG TERM GOAL #4   Title report pain < 4/10 with activity (02/27/14)   Time 6   Period Weeks   Status Achieved               Plan - 02/07/14 1614    Clinical Impression  Statement Pt has met all goals and no further indication to continue with physical therapy.  Will d/c today.   PT Next Visit Plan d/c PT   Consulted and Agree with Plan of Care Patient        Problem List Patient Active Problem List   Diagnosis Date Noted  . Scapular dyskinesis 12/27/2013  . Trapezius muscle spasm 12/27/2013  . Fibroid     Laureen Abrahams, PT, DPT 02/07/2014 4:18 PM  [place], [place], [address] Phone: [phone]   Fax:  [phone]   PHYSICAL THERAPY DISCHARGE SUMMARY  Visits from Start of Care: 4  Current functional level related to goals / functional outcomes: See above all goals met   Remaining deficits: Pt reports occasional pain however no longer affecting work or ADLs.  Discussed posture and effects of pain and decreased mobility due to poor posture.  Pt ready to transition to community and home fitness programs with increased awareness of posture  and positioning.   Education / Equipment: HEP, posture/body mechanics  Plan: Patient agrees to discharge.  Patient goals were met. Patient is being discharged due to meeting the stated rehab goals.  ?????       Laureen Abrahams, PT, DPT 02/07/2014 4:20 PM  [facility] [address], [place] [address]  [phone] (office) [phone] (fax)

## 2014-02-07 NOTE — Patient Instructions (Signed)
Sleeping on Back  Place pillow under knees. A pillow with cervical support and a roll around waist are also helpful. Copyright  VHI. All rights reserved.  Sleeping on Side Place pillow between knees. Use cervical support under neck and a roll around waist as needed. Copyright  VHI. All rights reserved.   Sleeping on Stomach   If this is the only desirable sleeping position, place pillow under lower legs, and under stomach or chest as needed.  Posture - Sitting   Sit upright, head facing forward. Try using a roll to support lower back. Keep shoulders relaxed, and avoid rounded back. Keep hips level with knees. Avoid crossing legs for long periods. Stand to Sit / Sit to Stand   To sit: Bend knees to lower self onto front edge of chair, then scoot back on seat. To stand: Reverse sequence by placing one foot forward, and scoot to front of seat. Use rocking motion to stand up.   Work Height and Reach  Ideal work height is no more than 2 to 4 inches below elbow level when standing, and at elbow level when sitting. Reaching should be limited to arm's length, with elbows slightly bent.  Bending  Bend at hips and knees, not back. Keep feet shoulder-width apart.    Posture - Standing   Good posture is important. Avoid slouching and forward head thrust. Maintain curve in low back and align ears over shoul- ders, hips over ankles.  Alternating Positions   Alternate tasks and change positions frequently to reduce fatigue and muscle tension. Take rest breaks. Computer Work   Position work to face forward. Use proper work and seat height. Keep shoulders back and down, wrists straight, and elbows at right angles. Use chair that provides full back support. Add footrest and lumbar roll as needed.  Getting Into / Out of Car  Lower self onto seat, scoot back, then bring in one leg at a time. Reverse sequence to get out.  Dressing  Lie on back to pull socks or slacks over feet, or sit  and bend leg while keeping back straight.    Housework - Sink  Place one foot on ledge of cabinet under sink when standing at sink for prolonged periods.   Pushing / Pulling  Pushing is preferable to pulling. Keep back in proper alignment, and use leg muscles to do the work.  Deep Squat   Squat and lift with both arms held against upper trunk. Tighten stomach muscles without holding breath. Use smooth movements to avoid jerking.  Avoid Twisting   Avoid twisting or bending back. Pivot around using foot movements, and bend at knees if needed when reaching for articles.  Carrying Luggage   Distribute weight evenly on both sides. Use a cart whenever possible. Do not twist trunk. Move body as a unit.   Lifting Principles .Maintain proper posture and head alignment. .Slide object as close as possible before lifting. .Move obstacles out of the way. .Test before lifting; ask for help if too heavy. .Tighten stomach muscles without holding breath. .Use smooth movements; do not jerk. .Use legs to do the work, and pivot with feet. .Distribute the work load symmetrically and close to the center of trunk. .Push instead of pull whenever possible.   Ask For Help   Ask for help and delegate to others when possible. Coordinate your movements when lifting together, and maintain the low back curve.  Log Roll   Lying on back, bend left knee and place left   arm across chest. Roll all in one movement to the right. Reverse to roll to the left. Always move as one unit. Housework - Sweeping  Use long-handled equipment to avoid stooping.   Housework - Wiping  Position yourself as close as possible to reach work surface. Avoid straining your back.  Laundry - Unloading Wash   To unload small items at bottom of washer, lift leg opposite to arm being used to reach.  [place] close to area to be raked. Use arm movements to do the work. Keep back straight and avoid  twisting.     Cart  When reaching into cart with one arm, lift opposite leg to keep back straight.   Getting Into / Out of Bed  Lower self to lie down on one side by raising legs and lowering head at the same time. Use arms to assist moving without twisting. Bend both knees to roll onto back if desired. To sit up, start from lying on side, and use same move-ments in reverse. Housework - Vacuuming  Hold the vacuum with arm held at side. Step back and forth to move it, keeping head up. Avoid twisting.   Laundry - IT consultant so that bending and twisting can be avoided.   Laundry - Unloading Dryer  Squat down to reach into clothes dryer or use a reacher.  Gardening - Weeding / Probation officer or Kneel. Knee pads may be helpful.                     Scapular Retraction: Bilateral   Facing anchor, pull arms back, bringing shoulder blades together.  You can stand for this exercise. Repeat _10-15___ times per set. Do __1__ sets per session. Do __1-2__ sessions per day.  http://orth.exer.us/177   Copyright  VHI. All rights reserved.   Healthy Back - Shoulder Roll   Stand straight with arms relaxed at sides. Roll shoulders backward continuously. Do __10-15__ times. This exercise can also be done one shoulder at a time.  Copyright  VHI. All rights reserved.   Laureen Abrahams, PT, DPT 02/07/2014 3:56 PM  [facility] [address] [place] [address]  [phone] (office) [phone] (fax)

## 2014-02-17 DIAGNOSIS — I10 Essential (primary) hypertension: Secondary | ICD-10-CM

## 2014-02-17 HISTORY — DX: Essential (primary) hypertension: I10

## 2014-03-21 ENCOUNTER — Other Ambulatory Visit: Payer: Self-pay | Admitting: Sports Medicine

## 2014-05-10 ENCOUNTER — Other Ambulatory Visit (HOSPITAL_COMMUNITY): Payer: Self-pay | Admitting: Internal Medicine

## 2014-05-10 ENCOUNTER — Other Ambulatory Visit: Payer: Self-pay | Admitting: Gynecology

## 2014-05-10 DIAGNOSIS — Z1231 Encounter for screening mammogram for malignant neoplasm of breast: Secondary | ICD-10-CM

## 2014-05-15 ENCOUNTER — Other Ambulatory Visit: Payer: Self-pay | Admitting: Sports Medicine

## 2014-05-15 NOTE — Telephone Encounter (Signed)
Is this ok to refill?  

## 2014-06-07 ENCOUNTER — Ambulatory Visit (HOSPITAL_COMMUNITY)
Admission: RE | Admit: 2014-06-07 | Discharge: 2014-06-07 | Disposition: A | Discharge status: Home / Self Care | Payer: 59 | Source: Ambulatory Visit | Attending: Gynecology | Admitting: Gynecology

## 2014-06-07 DIAGNOSIS — Z1231 Encounter for screening mammogram for malignant neoplasm of breast: Secondary | ICD-10-CM | POA: Diagnosis not present

## 2014-06-27 ENCOUNTER — Encounter: Payer: 59 | Admitting: Women's Health

## 2014-07-11 ENCOUNTER — Other Ambulatory Visit (HOSPITAL_COMMUNITY)
Admission: RE | Admit: 2014-07-11 | Discharge: 2014-07-11 | Disposition: A | Discharge status: Home / Self Care | Payer: 59 | Source: Ambulatory Visit | Attending: Women's Health | Admitting: Women's Health

## 2014-07-11 ENCOUNTER — Encounter: Payer: Self-pay | Admitting: Women's Health

## 2014-07-11 ENCOUNTER — Ambulatory Visit (INDEPENDENT_AMBULATORY_CARE_PROVIDER_SITE_OTHER): Payer: 59 | Admitting: Women's Health

## 2014-07-11 VITALS — BP 140/80 | Ht 67.0 in | Wt 214.0 lb

## 2014-07-11 DIAGNOSIS — Z01419 Encounter for gynecological examination (general) (routine) without abnormal findings: Secondary | ICD-10-CM

## 2014-07-11 DIAGNOSIS — Z1151 Encounter for screening for human papillomavirus (HPV): Secondary | ICD-10-CM | POA: Diagnosis present

## 2014-07-11 NOTE — Patient Instructions (Signed)
Health Recommendations for Postmenopausal Women Respected and ongoing research has looked at the most common causes of death, disability, and poor quality of life in postmenopausal women. The causes include heart disease, diseases of blood vessels, diabetes, depression, cancer, and bone loss (osteoporosis). Many things can be done to help lower the chances of developing these and other common problems. CARDIOVASCULAR DISEASE Heart Disease: A heart attack is a medical emergency. Know the signs and symptoms of a heart attack. Below are things women can do to reduce their risk for heart disease.   Do not smoke. If you smoke, quit.  Aim for a healthy weight. Being overweight causes many preventable deaths. Eat a healthy and balanced diet and drink an adequate amount of liquids.  Get moving. Make a commitment to be more physically active. Aim for 30 minutes of activity on most, if not all days of the week.  Eat for heart health. Choose a diet that is low in saturated fat and cholesterol and eliminate trans fat. Include whole grains, vegetables, and fruits. Read and understand the labels on food containers before buying.  Know your numbers. Ask your caregiver to check your blood pressure, cholesterol (total, HDL, LDL, triglycerides) and blood glucose. Work with your caregiver on improving your entire clinical picture.  High blood pressure. Limit or stop your table salt intake (try salt substitute and food seasonings). Avoid salty foods and drinks. Read labels on food containers before buying. Eating well and exercising can help control high blood pressure. STROKE  Stroke is a medical emergency. Stroke may be the result of a blood clot in a blood vessel in the brain or by a brain hemorrhage (bleeding). Know the signs and symptoms of a stroke. To lower the risk of developing a stroke:  Avoid fatty foods.  Quit smoking.  Control your diabetes, blood pressure, and irregular heart rate. THROMBOPHLEBITIS  (BLOOD CLOT) OF THE LEG  Becoming overweight and leading a stationary lifestyle may also contribute to developing blood clots. Controlling your diet and exercising will help lower the risk of developing blood clots. CANCER SCREENING  Breast Cancer: Take steps to reduce your risk of breast cancer.  You should practice "breast self-awareness." This means understanding the normal appearance and feel of your breasts and should include breast self-examination. Any changes detected, no matter how small, should be reported to your caregiver.  After age 4, you should have a clinical breast exam (CBE) every year.  Starting at age 51, you should consider having a mammogram (breast X-ray) every year.  If you have a family history of breast cancer, talk to your caregiver about genetic screening.  If you are at high risk for breast cancer, talk to your caregiver about having an MRI and a mammogram every year.  Intestinal or Stomach Cancer: Tests to consider are a rectal exam, fecal occult blood, sigmoidoscopy, and colonoscopy. Women who are high risk may need to be screened at an earlier age and more often.  Cervical Cancer:  Beginning at age 51, you should have a Pap test every 3 years as long as the past 3 Pap tests have been normal.  If you have had past treatment for cervical cancer or a condition that could lead to cancer, you need Pap tests and screening for cancer for at least 20 years after your treatment.  If you had a hysterectomy for a problem that was not cancer or a condition that could lead to cancer, then you no longer need Pap tests.  If you are between ages 65 and 70, and you have had normal Pap tests going back 10 years, you no longer need Pap tests.  If Pap tests have been discontinued, risk factors (such as a new sexual partner) need to be reassessed to determine if screening should be resumed.  Some medical problems can increase the chance of getting cervical cancer. In these  cases, your caregiver may recommend more frequent screening and Pap tests.  Uterine Cancer: If you have vaginal bleeding after reaching menopause, you should notify your caregiver.  Ovarian Cancer: Other than yearly pelvic exams, there are no reliable tests available to screen for ovarian cancer at this time except for yearly pelvic exams.  Lung Cancer: Yearly chest X-rays can detect lung cancer and should be done on high risk women, such as cigarette smokers and women with chronic lung disease (emphysema).  Skin Cancer: A complete body skin exam should be done at your yearly examination. Avoid overexposure to the sun and ultraviolet light lamps. Use a strong sun block cream when in the sun. All of these things are important for lowering the risk of skin cancer. MENOPAUSE Menopause Symptoms: Hormone therapy products are effective for treating symptoms associated with menopause:  Moderate to severe hot flashes.  Night sweats.  Mood swings.  Headaches.  Tiredness.  Loss of sex drive.  Insomnia.  Other symptoms. Hormone replacement carries certain risks, especially in older women. Women who use or are thinking about using estrogen or estrogen with progestin treatments should discuss that with their caregiver. Your caregiver will help you understand the benefits and risks. The ideal dose of hormone replacement therapy is not known. The Food and Drug Administration (FDA) has concluded that hormone therapy should be used only at the lowest doses and for the shortest amount of time to reach treatment goals.  OSTEOPOROSIS Protecting Against Bone Loss and Preventing Fracture If you use hormone therapy for prevention of bone loss (osteoporosis), the risks for bone loss must outweigh the risk of the therapy. Ask your caregiver about other medications known to be safe and effective for preventing bone loss and fractures. To guard against bone loss or fractures, the following is recommended:  If  you are younger than age 50, take 1000 mg of calcium and at least 600 mg of Vitamin D per day.  If you are older than age 50 but younger than age 70, take 1200 mg of calcium and at least 600 mg of Vitamin D per day.  If you are older than age 70, take 1200 mg of calcium and at least 800 mg of Vitamin D per day. Smoking and excessive alcohol intake increases the risk of osteoporosis. Eat foods rich in calcium and vitamin D and do weight bearing exercises several times a week as your caregiver suggests. DIABETES Diabetes Mellitus: If you have type I or type 2 diabetes, you should keep your blood sugar under control with diet, exercise, and recommended medication. Avoid starchy and fatty foods, and too many sweets. Being overweight can make diabetes control more difficult. COGNITION AND MEMORY Cognition and Memory: Menopausal hormone therapy is not recommended for the prevention of cognitive disorders such as Alzheimer's disease or memory loss.  DEPRESSION  Depression may occur at any age, but it is common in elderly women. This may be because of physical, medical, social (loneliness), or financial problems and needs. If you are experiencing depression because of medical problems and control of symptoms, talk to your caregiver about this. Physical   activity and exercise may help with mood and sleep. Community and volunteer involvement may improve your sense of value and worth. If you have depression and you feel that the problem is getting worse or becoming severe, talk to your caregiver about which treatment options are best for you. ACCIDENTS  Accidents are common and can be serious in elderly woman. Prepare your house to prevent accidents. Eliminate throw rugs, place hand bars in bath, shower, and toilet areas. Avoid wearing high heeled shoes or walking on wet, snowy, and icy areas. Limit or stop driving if you have vision or hearing problems, or if you feel you are unsteady with your movements and  reflexes. HEPATITIS C Hepatitis C is a type of viral infection affecting the liver. It is spread mainly through contact with blood from an infected person. It can be treated, but if left untreated, it can lead to severe liver damage over the years. Many people who are infected do not know that the virus is in their blood. If you are a "baby-boomer", it is recommended that you have one screening test for Hepatitis C. IMMUNIZATIONS  Several immunizations are important to consider having during your senior years, including:   Tetanus, diphtheria, and pertussis booster shot.  Influenza every year before the flu season begins.  Pneumonia vaccine.  Shingles vaccine.  Others, as indicated based on your specific needs. Talk to your caregiver about these. Document Released: 03/28/2005 Document Revised: 06/20/2013 Document Reviewed: 11/22/2007 ExitCare Patient Information 2015 [employer]. This information is not intended to replace advice given to you by your health care provider. Make sure you discuss any questions you have with your health care provider. Exercise to Stay Healthy Exercise helps you become and stay healthy. EXERCISE IDEAS AND TIPS Choose exercises that:  You enjoy.  Fit into your day. You do not need to exercise really hard to be healthy. You can do exercises at a slow or medium level and stay healthy. You can:  Stretch before and after working out.  Try yoga, Pilates, or tai chi.  Lift weights.  Walk fast, swim, jog, run, climb stairs, bicycle, dance, or rollerskate.  Take aerobic classes. Exercises that burn about 150 calories:  Running 1  miles in 15 minutes.  Playing volleyball for 45 to 60 minutes.  Washing and waxing a car for 45 to 60 minutes.  Playing touch football for 45 minutes.  Walking 1  miles in 35 minutes.  Pushing a stroller 1  miles in 30 minutes.  Playing basketball for 30 minutes.  Raking leaves for 30 minutes.  Bicycling 5  miles in 30 minutes.  Walking 2 miles in 30 minutes.  Dancing for 30 minutes.  Shoveling snow for 15 minutes.  Swimming laps for 20 minutes.  Walking up stairs for 15 minutes.  Bicycling 4 miles in 15 minutes.  Gardening for 30 to 45 minutes.  Jumping rope for 15 minutes.  Washing windows or floors for 45 to 60 minutes. Document Released: 03/08/2010 Document Revised: 04/28/2011 Document Reviewed: 03/08/2010 ExitCare Patient Information 2015 [employer]. This information is not intended to replace advice given to you by your health care provider. Make sure you discuss any questions you have with your health care provider.  

## 2014-07-11 NOTE — Progress Notes (Signed)
Andrea Lynn [birth year] [id number]    History:    Presents for annual exam.  Postmenopausal/no bleeding/no HRT. History of infertility and failed IVF. Small fibroids several 1-2 cm in size. Labs and DEXA and primary care reports as normal. Normal Pap and mammogram history. Has not had a colonoscopy.  Past medical history, past surgical history, family history and social history were all reviewed and documented in the EPIC chart. Works in Therapist, music. Numerous family members with hypertension. From [place], female circumcision.  ROS:  A ROS was performed and pertinent positives and negatives are included.  Exam:  Filed Vitals:   07/11/14 1520  BP: 140/80    General appearance:  Normal Thyroid:  Symmetrical, normal in size, without palpable masses or nodularity. Respiratory  Auscultation:  Clear without wheezing or rhonchi Cardiovascular  Auscultation:  Regular rate, without rubs, murmurs or gallops  Edema/varicosities:  Not grossly evident Abdominal  Soft,nontender, without masses, guarding or rebound.  Liver/spleen:  No organomegaly noted  Hernia:  None appreciated  Skin  Inspection:  Grossly normal   Breasts: Examined lying and sitting.     Right: Without masses, retractions, discharge or axillary adenopathy.     Left: Without masses, retractions, discharge or axillary adenopathy. Gentitourinary   Inguinal/mons:  Normal without inguinal adenopathy  External genitalia:  Female circumcision  BUS/Urethra/Skene's glands:  Normal  Vagina:  Normal  Cervix:  Normal  Uterus:   normal in size, shape and contour.  Midline and mobile  Adnexa/parametria:     Rt: Without masses or tenderness.   Lt: Without masses or tenderness.  Anus and perineum: Normal  Digital rectal exam: Normal sphincter tone without palpated masses or tenderness  Assessment/Plan:  49 y.o. MBF G1 P0/infertility for annual exam with no complaints.  Postmenopausal/no HRT/no bleeding Small fibroids Borderline blood  pressure Obesity Labs/DEXA primary care manages  Plan: Continue follow-up with primary care for mildly elevated blood pressure. Reviewed importance of decreasing calories and increasing exercise for weight loss. SBE's, continue annual screening mammogram, calcium rich diet, vitamin D 2000 daily. Reports vitamin D normal level after being on supplement per primary care. UA, Pap with HR HPV typing, new screening guidelines reviewed pap normal 2013.  Huel Cote [facility], 5:16 PM 07/11/2014

## 2014-07-12 LAB — URINALYSIS W MICROSCOPIC + REFLEX CULTURE
Bacteria, UA: NONE SEEN
Bilirubin Urine: NEGATIVE
Casts: NONE SEEN
Crystals: NONE SEEN
Glucose, UA: NEGATIVE mg/dL
Hgb urine dipstick: NEGATIVE
Ketones, ur: NEGATIVE mg/dL
Leukocytes, UA: NEGATIVE
Nitrite: NEGATIVE
Protein, ur: NEGATIVE mg/dL
Specific Gravity, Urine: 1.014 (ref 1.005–1.030)
Squamous Epithelial / LPF: NONE SEEN
Urobilinogen, UA: 0.2 mg/dL (ref 0.0–1.0)
pH: 7 (ref 5.0–8.0)

## 2014-07-14 LAB — CYTOLOGY - PAP

## 2014-10-27 ENCOUNTER — Other Ambulatory Visit: Payer: Self-pay | Admitting: *Deleted

## 2014-10-27 MED ORDER — CYCLOBENZAPRINE HCL 5 MG PO TABS: 5.0000 mg | ORAL_TABLET | Freq: Every day | ORAL | Status: AC

## 2015-02-23 MED FILL — IRBESARTAN 75 MG TABLET: 75 | 30 days supply | Qty: 30 | Fill #2

## 2015-03-06 DIAGNOSIS — Z6832 Body mass index (BMI) 32.0-32.9, adult: Secondary | ICD-10-CM | POA: Diagnosis not present

## 2015-03-06 DIAGNOSIS — E669 Obesity, unspecified: Secondary | ICD-10-CM | POA: Diagnosis not present

## 2015-03-06 DIAGNOSIS — I10 Essential (primary) hypertension: Secondary | ICD-10-CM | POA: Diagnosis not present

## 2015-03-19 MED FILL — CYCLOBENZAPRINE 5 MG TABLET: 5 | 10 days supply | Qty: 30 | Fill #1

## 2015-03-19 MED FILL — LATANOPROST 0.005% EYE DRP: 0.005 | 25 days supply | Qty: 3 | Fill #2

## 2015-03-19 MED FILL — COMBIGAN EYE DROPS: 0.2-0.5 | 25 days supply | Qty: 5 | Fill #2

## 2015-03-20 MED FILL — IRBESARTAN 75 MG TABLET: 75 | 30 days supply | Qty: 30 | Fill #3

## 2015-03-27 ENCOUNTER — Other Ambulatory Visit: Payer: Self-pay

## 2015-03-27 DIAGNOSIS — Z1231 Encounter for screening mammogram for malignant neoplasm of breast: Secondary | ICD-10-CM

## 2015-04-06 DIAGNOSIS — M859 Disorder of bone density and structure, unspecified: Secondary | ICD-10-CM | POA: Diagnosis not present

## 2015-04-06 DIAGNOSIS — Z0000 Encounter for general adult medical examination without abnormal findings: Secondary | ICD-10-CM | POA: Diagnosis not present

## 2015-04-06 DIAGNOSIS — E784 Other hyperlipidemia: Secondary | ICD-10-CM | POA: Diagnosis not present

## 2015-04-12 DIAGNOSIS — R252 Cramp and spasm: Secondary | ICD-10-CM | POA: Diagnosis not present

## 2015-04-12 DIAGNOSIS — Z1389 Encounter for screening for other disorder: Secondary | ICD-10-CM | POA: Diagnosis not present

## 2015-04-12 DIAGNOSIS — E668 Other obesity: Secondary | ICD-10-CM | POA: Diagnosis not present

## 2015-04-12 DIAGNOSIS — Z0000 Encounter for general adult medical examination without abnormal findings: Secondary | ICD-10-CM | POA: Diagnosis not present

## 2015-04-12 DIAGNOSIS — K219 Gastro-esophageal reflux disease without esophagitis: Secondary | ICD-10-CM | POA: Diagnosis not present

## 2015-04-12 DIAGNOSIS — F438 Other reactions to severe stress: Secondary | ICD-10-CM | POA: Diagnosis not present

## 2015-04-12 DIAGNOSIS — E784 Other hyperlipidemia: Secondary | ICD-10-CM | POA: Diagnosis not present

## 2015-04-12 DIAGNOSIS — I10 Essential (primary) hypertension: Secondary | ICD-10-CM | POA: Diagnosis not present

## 2015-04-12 DIAGNOSIS — M859 Disorder of bone density and structure, unspecified: Secondary | ICD-10-CM | POA: Diagnosis not present

## 2015-04-16 DIAGNOSIS — Z1212 Encounter for screening for malignant neoplasm of rectum: Secondary | ICD-10-CM | POA: Diagnosis not present

## 2015-04-19 MED FILL — LATANOPROST 0.005% EYE DRP: 0.005 | 25 days supply | Qty: 3 | Fill #3

## 2015-04-19 MED FILL — IRBESARTAN 75 MG TABLET: 75 | 90 days supply | Qty: 90 | Fill #0

## 2015-04-19 MED FILL — COMBIGAN EYE DROPS: 0.2-0.5 | 25 days supply | Qty: 5 | Fill #3

## 2015-05-07 DIAGNOSIS — H52223 Regular astigmatism, bilateral: Secondary | ICD-10-CM | POA: Diagnosis not present

## 2015-05-07 DIAGNOSIS — H5212 Myopia, left eye: Secondary | ICD-10-CM | POA: Diagnosis not present

## 2015-05-07 DIAGNOSIS — H524 Presbyopia: Secondary | ICD-10-CM | POA: Diagnosis not present

## 2015-05-15 MED FILL — COMBIGAN EYE DROPS: 0.2-0.5 | 25 days supply | Qty: 5 | Fill #4

## 2015-05-15 MED FILL — LATANOPROST 0.005% EYE DRP: 0.005 | 25 days supply | Qty: 3 | Fill #4

## 2015-06-08 ENCOUNTER — Ambulatory Visit
Admission: RE | Admit: 2015-06-08 | Discharge: 2015-06-08 | Disposition: A | Discharge status: Home / Self Care | Payer: 59 | Source: Ambulatory Visit

## 2015-06-08 DIAGNOSIS — Z1231 Encounter for screening mammogram for malignant neoplasm of breast: Secondary | ICD-10-CM

## 2015-06-12 ENCOUNTER — Encounter: Payer: Self-pay | Admitting: Women's Health

## 2015-06-18 MED FILL — COMBIGAN EYE DROPS: 0.2-0.5 | 25 days supply | Qty: 5 | Fill #5

## 2015-06-18 MED FILL — DICLOFENAC SODIUM 1% GEL: 1 | 6 days supply | Qty: 200 | Fill #1

## 2015-06-18 MED FILL — LATANOPROST 0.005% EYE DRP: 0.005 | 25 days supply | Qty: 3 | Fill #5

## 2015-07-12 MED FILL — IRBESARTAN 75 MG TABLET: 75 | 90 days supply | Qty: 90 | Fill #0

## 2015-07-12 MED FILL — LATANOPROST 0.005% EYE DRP: 0.005 | 25 days supply | Qty: 3 | Fill #0

## 2015-07-17 MED FILL — COMBIGAN EYE DROPS: 0.2-0.5 | 25 days supply | Qty: 5 | Fill #0

## 2015-08-01 ENCOUNTER — Encounter: Payer: 59 | Admitting: Women's Health

## 2015-08-15 MED FILL — LATANOPROST 0.005% EYE DRP: 0.005 | 25 days supply | Qty: 3 | Fill #1

## 2015-08-15 MED FILL — COMBIGAN EYE DROPS: 0.2-0.5 | 25 days supply | Qty: 5 | Fill #1

## 2015-08-17 ENCOUNTER — Ambulatory Visit (INDEPENDENT_AMBULATORY_CARE_PROVIDER_SITE_OTHER): Payer: 59 | Admitting: Women's Health

## 2015-08-17 ENCOUNTER — Encounter: Payer: Self-pay | Admitting: Women's Health

## 2015-08-17 VITALS — BP 119/89 | Ht 67.0 in | Wt 189.7 lb

## 2015-08-17 DIAGNOSIS — Z01419 Encounter for gynecological examination (general) (routine) without abnormal findings: Secondary | ICD-10-CM

## 2015-08-17 NOTE — Patient Instructions (Signed)
[facility]  Dr Carlean Purl  [id number]  Menopause is a normal process in which your reproductive ability comes to an end. This process happens gradually over a span of months to years, usually between the ages of 52 and 42. Menopause is complete when you have missed 12 consecutive menstrual periods. It is important to talk with your health care provider about some of the most common conditions that affect postmenopausal women, such as heart disease, cancer, and bone loss (osteoporosis). Adopting a healthy lifestyle and getting preventive care can help to promote your health and wellness. Those actions can also lower your chances of developing some of these common conditions. WHAT SHOULD I KNOW ABOUT MENOPAUSE? During menopause, you may experience a number of symptoms, such as:  Moderate-to-severe hot flashes.  Night sweats.  Decrease in sex drive.  Mood swings.  Headaches.  Tiredness.  Irritability.  Memory problems.  Insomnia. Choosing to treat or not to treat menopausal changes is an individual decision that you make with your health care provider. WHAT SHOULD I KNOW ABOUT HORMONE REPLACEMENT THERAPY AND SUPPLEMENTS? Hormone therapy products are effective for treating symptoms that are associated with menopause, such as hot flashes and night sweats. Hormone replacement carries certain risks, especially as you become older. If you are thinking about using estrogen or estrogen with progestin treatments, discuss the benefits and risks with your health care provider. WHAT SHOULD I KNOW ABOUT HEART DISEASE AND STROKE? Heart disease, heart attack, and stroke become more likely as you age. This may be due, in part, to the hormonal changes that your body experiences during menopause. These can affect how your body processes dietary fats, triglycerides, and cholesterol. Heart attack and stroke are both medical emergencies. There are many things that you can do to help prevent heart disease and  stroke:  Have your blood pressure checked at least every 1-2 years. High blood pressure causes heart disease and increases the risk of stroke.  If you are 52-34 years old, ask your health care provider if you should take aspirin to prevent a heart attack or a stroke.  Do not use any tobacco products, including cigarettes, chewing tobacco, or electronic cigarettes. If you need help quitting, ask your health care provider.  It is important to eat a healthy diet and maintain a healthy weight.  Be sure to include plenty of vegetables, fruits, low-fat dairy products, and lean protein.  Avoid eating foods that are high in solid fats, added sugars, or salt (sodium).  Get regular exercise. This is one of the most important things that you can do for your health.  Try to exercise for at least 150 minutes each week. The type of exercise that you do should increase your heart rate and make you sweat. This is known as moderate-intensity exercise.  Try to do strengthening exercises at least twice each week. Do these in addition to the moderate-intensity exercise.  Know your numbers.Ask your health care provider to check your cholesterol and your blood glucose. Continue to have your blood tested as directed by your health care provider. WHAT SHOULD I KNOW ABOUT CANCER SCREENING? There are several types of cancer. Take the following steps to reduce your risk and to catch any cancer development as early as possible. Breast Cancer  Practice breast self-awareness.  This means understanding how your breasts normally appear and feel.  It also means doing regular breast self-exams. Let your health care provider know about any changes, no matter how small.  If you are  52 or older, have a clinician do a breast exam (clinical breast exam or CBE) every year. Depending on your age, family history, and medical history, it may be recommended that you also have a yearly breast X-ray (mammogram).  If you  have a family history of breast cancer, talk with your health care provider about genetic screening.  If you are at high risk for breast cancer, talk with your health care provider about having an MRI and a mammogram every year.  Breast cancer (BRCA) gene test is recommended for women who have family members with BRCA-related cancers. Results of the assessment will determine the need for genetic counseling and BRCA1 and for BRCA2 testing. BRCA-related cancers include these types:  Breast. This occurs in males or females.  Ovarian.  Tubal. This may also be called fallopian tube cancer.  Cancer of the abdominal or pelvic lining (peritoneal cancer).  Prostate.  Pancreatic. Cervical, Uterine, and Ovarian Cancer Your health care provider may recommend that you be screened regularly for cancer of the pelvic organs. These include your ovaries, uterus, and vagina. This screening involves a pelvic exam, which includes checking for microscopic changes to the surface of your cervix (Pap test).  For women ages 21-65, health care providers may recommend a pelvic exam and a Pap test every three years. For women ages 30-65, they may recommend the Pap test and pelvic exam, combined with testing for human papilloma virus (HPV), every five years. Some types of HPV increase your risk of cervical cancer. Testing for HPV may also be done on women of any age who have unclear Pap test results.  Other health care providers may not recommend any screening for nonpregnant women who are considered low risk for pelvic cancer and have no symptoms. Ask your health care provider if a screening pelvic exam is right for you.  If you have had past treatment for cervical cancer or a condition that could lead to cancer, you need Pap tests and screening for cancer for at least 20 years after your treatment. If Pap tests have been discontinued for you, your risk factors (such as having a new sexual partner) need to be reassessed  to determine if you should start having screenings again. Some women have medical problems that increase the chance of getting cervical cancer. In these cases, your health care provider may recommend that you have screening and Pap tests more often.  If you have a family history of uterine cancer or ovarian cancer, talk with your health care provider about genetic screening.  If you have vaginal bleeding after reaching menopause, tell your health care provider.  There are currently no reliable tests available to screen for ovarian cancer. Lung Cancer Lung cancer screening is recommended for adults 55-80 years old who are at high risk for lung cancer because of a history of smoking. A yearly low-dose CT scan of the lungs is recommended if you:  Currently smoke.  Have a history of at least 30 pack-years of smoking and you currently smoke or have quit within the past 15 years. A pack-year is smoking an average of one pack of cigarettes per day for one year. Yearly screening should:  Continue until it has been 15 years since you quit.  Stop if you develop a health problem that would prevent you from having lung cancer treatment. Colorectal Cancer  This type of cancer can be detected and can often be prevented.  Routine colorectal cancer screening usually begins at age 50 and   continues through age 75.  If you have risk factors for colon cancer, your health care provider may recommend that you be screened at an earlier age.  If you have a family history of colorectal cancer, talk with your health care provider about genetic screening.  Your health care provider may also recommend using home test kits to check for hidden blood in your stool.  A small camera at the end of a tube can be used to examine your colon directly (sigmoidoscopy or colonoscopy). This is done to check for the earliest forms of colorectal cancer.  Direct examination of the colon should be repeated every 5-10 years until  age 75. However, if early forms of precancerous polyps or small growths are found or if you have a family history or genetic risk for colorectal cancer, you may need to be screened more often. Skin Cancer  Check your skin from head to toe regularly.  Monitor any moles. Be sure to tell your health care provider:  About any new moles or changes in moles, especially if there is a change in a mole's shape or color.  If you have a mole that is larger than the size of a pencil eraser.  If any of your family members has a history of skin cancer, especially at a Ashara [place]ouns[place] age, talk with your health care provider about genetic screening.  Always use sunscreen. Apply sunscreen liberally and repeatedly throughout the day.  Whenever you are outside, protect yourself by wearing long sleeves, pants, a wide-brimmed hat, and sunglasses. WHAT SHOULD I KNOW ABOUT OSTEOPOROSIS? Osteoporosis is a condition in which bone destruction happens more quickly than new bone creation. After menopause, you may be at an increased risk for osteoporosis. To help prevent osteoporosis or the bone fractures that can happen because of osteoporosis, the following is recommended:  If you are 19-50 years old, get at least 1,000 mg of calcium and at least 600 mg of vitamin D per day.  If you are older than age 50 but younger than age 70, get at least 1,200 mg of calcium and at least 600 mg of vitamin D per day.  If you are older than age 70, get at least 1,200 mg of calcium and at least 800 mg of vitamin D per day. Smoking and excessive alcohol intake increase the risk of osteoporosis. Eat foods that are rich in calcium and vitamin D, and do weight-bearing exercises several times each week as directed by your health care provider. WHAT SHOULD I KNOW ABOUT HOW MENOPAUSE AFFECTS MY MENTAL HEALTH? Depression may occur at any age, but it is more common as you become older. Common symptoms of depression include:  Low or sad  mood.  Changes in sleep patterns.  Changes in appetite or eating patterns.  Feeling an overall lack of motivation or enjoyment of activities that you previously enjoyed.  Frequent crying spells. Talk with your health care provider if you think that you are experiencing depression. WHAT SHOULD I KNOW ABOUT IMMUNIZATIONS? It is important that you get and maintain your immunizations. These include:  Tetanus, diphtheria, and pertussis (Tdap) booster vaccine.  Influenza every year before the flu season begins.  Pneumonia vaccine.  Shingles vaccine. Your health care provider may also recommend other immunizations.   This information is not intended to replace advice given to you by your health care provider. Make sure you discuss any questions you have with your health care provider.   Document Released: 03/28/2005 Document Revised: 02/24/2014 Document   Reviewed: 10/06/2013 Elsevier Interactive Patient Education [employer].

## 2015-08-17 NOTE — Progress Notes (Signed)
Andrea Lynn 52/10/1965 [id number]    History:    Presents for annual exam.  Menopausal on no HRT. History of infertility failed IVF. History of small fibroids and myomectomy in the past. Normal Pap and mammogram history. Labs and DEXA were done at primary care reports normal labs. Currently on any antihypertensive she does not remember name of medication. Was started on this past year. Brother kidney failure with kidney transplant from hypertension.  Past medical history, past surgical history, family history and social history were all reviewed and documented in the EPIC chart. 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  ROS:  A ROS was performed and pertinent positives and negatives are included.  Exam:  Filed Vitals:   08/17/15 1518  BP: 119/89    General appearance:  Normal Thyroid:  Symmetrical, normal in size, without palpable masses or nodularity. Respiratory  Auscultation:  Clear without wheezing or rhonchi Cardiovascular  Auscultation:  Regular rate, without rubs, murmurs or gallops  Edema/varicosities:  Not grossly evident Abdominal  Soft,nontender, without masses, guarding or rebound.  Liver/spleen:  No organomegaly noted  Hernia:  None appreciated  Skin  Inspection:  Grossly normal   Breasts: Examined lying and sitting.     Right: Without masses, retractions, discharge or axillary adenopathy.     Left: Without masses, retractions, discharge or axillary adenopathy. Gentitourinary   Inguinal/mons:  Normal without inguinal adenopathy  External genitalia: Female circumcision  BUS/Urethra/Skene's glands:  Normal  Vagina:  Normal  Cervix:  Normal  Uterus:   normal in size, shape and contour.  Midline and mobile  Adnexa/parametria:     Rt: Without masses or tenderness.   Lt: Without masses or tenderness.  Anus and perineum: Normal  Digital rectal exam: Normal sphincter tone without palpated masses or  tenderness  Assessment/Plan:  50 y.o.MBF G1P0  for annual exam with no complaints.  Postmenopausal on no HRT History of infertility failed IVF History of myomectomy Hypertension-primary care manages labs and meds Reports normal DEXA at primary care  Plan: SBE's, annual screening mammogram 3-D tomography reviewed and encouraged history of dense breast. Congratulated on 25 pound weight loss with diet and exercise. Vitamin D 2000 daily encouraged. UA, Pap normal with negative HR HPV 2016 new screening guidelines reviewed.    Huel Cote WHNP, 4:00 PM 52/30/2017

## 2015-09-08 MED FILL — LATANOPROST 0.005% EYE DRP: 0.005 | 75 days supply | Qty: 8 | Fill #2

## 2015-09-09 MED FILL — COMBIGAN EYE DROPS: 0.2-0.5 | 75 days supply | Qty: 15 | Fill #2

## 2015-09-09 MED FILL — IRBESARTAN 75 MG TABLET: 75 | 90 days supply | Qty: 90 | Fill #1

## 2015-09-09 MED FILL — DICLOFENAC SODIUM 1% GEL: 1 | 6 days supply | Qty: 200 | Fill #2

## 2015-09-11 MED FILL — CYCLOBENZAPRINE 5 MG TABLET: 5 | 30 days supply | Qty: 30 | Fill #0

## 2016-01-08 MED FILL — IRBESARTAN 75 MG TABLET: 75 | 90 days supply | Qty: 90 | Fill #2

## 2016-04-07 DIAGNOSIS — Z0000 Encounter for general adult medical examination without abnormal findings: Secondary | ICD-10-CM | POA: Diagnosis not present

## 2016-04-15 ENCOUNTER — Encounter: Payer: Self-pay | Admitting: Gastroenterology

## 2016-04-15 MED FILL — IRBESARTAN 75 MG TABLET: 75 | 90 days supply | Qty: 90 | Fill #3

## 2016-05-22 ENCOUNTER — Ambulatory Visit (AMBULATORY_SURGERY_CENTER): Payer: Self-pay | Admitting: *Deleted

## 2016-05-22 ENCOUNTER — Encounter: Payer: Self-pay | Admitting: Gastroenterology

## 2016-05-22 VITALS — Ht 67.0 in | Wt 194.0 lb

## 2016-05-22 DIAGNOSIS — Z1211 Encounter for screening for malignant neoplasm of colon: Secondary | ICD-10-CM

## 2016-05-22 MED ORDER — NA SULFATE-K SULFATE-MG SULF 17.5-3.13-1.6 GM/177ML PO SOLN: 1.0000 | Freq: Once | ORAL | 0 refills | Status: AC

## 2016-05-22 NOTE — Progress Notes (Signed)
No egg or soy allergy known to patient  No issues with past sedation with any surgeries  or procedures, no intubation problems  No diet pills per patient No home 02 use per patient  No blood thinners per patient  Pt denies issues with constipation  No A fib or A flutter   Emmi to e  mail   $15 dollar coupon to pt for prep

## 2016-05-23 ENCOUNTER — Telehealth: Payer: Self-pay | Admitting: Gastroenterology

## 2016-05-23 NOTE — Telephone Encounter (Signed)
Spoke with patient and she can't afford her prep.  I will send her the miralax instructions by mail. She is unable to come to the office.  She will call us if she has any questions.

## 2016-06-05 ENCOUNTER — Encounter: Payer: 59 | Admitting: Gastroenterology

## 2016-06-25 ENCOUNTER — Encounter: Payer: Self-pay | Admitting: Internal Medicine

## 2016-07-15 ENCOUNTER — Other Ambulatory Visit: Payer: Self-pay | Admitting: Sports Medicine

## 2016-07-15 MED FILL — IRBESARTAN 75 MG TABLET: 75 | 90 days supply | Qty: 90 | Fill #0

## 2016-07-16 ENCOUNTER — Other Ambulatory Visit: Payer: Self-pay | Admitting: Sports Medicine

## 2016-07-16 MED FILL — DICLOFENAC SODIUM 1% GEL: 1 | 6 days supply | Qty: 200 | Fill #0

## 2016-08-04 MED FILL — CYCLOBENZAPRINE 5 MG TABLET: 5 | 10 days supply | Qty: 30 | Fill #0

## 2016-08-19 ENCOUNTER — Encounter: Payer: 59 | Admitting: Women's Health

## 2016-09-18 MED FILL — DICLOFENAC SODIUM 1% GEL: 1 | 6 days supply | Qty: 200 | Fill #1

## 2016-09-18 MED FILL — CYCLOBENZAPRINE 5 MG TABLET: 5 | 10 days supply | Qty: 30 | Fill #0

## 2016-09-22 MED FILL — IRBESARTAN 75 MG TABLET: 75 | 90 days supply | Qty: 90 | Fill #1

## 2016-12-15 MED FILL — CYCLOBENZAPRINE 5 MG TABLET: 5 | 10 days supply | Qty: 30 | Fill #1

## 2016-12-15 MED FILL — IRBESARTAN 75 MG TABLET: 75 | 90 days supply | Qty: 90 | Fill #2

## 2017-01-21 ENCOUNTER — Ambulatory Visit (INDEPENDENT_AMBULATORY_CARE_PROVIDER_SITE_OTHER): Payer: BLUE CROSS/BLUE SHIELD

## 2017-01-21 ENCOUNTER — Ambulatory Visit (HOSPITAL_COMMUNITY)
Admission: EM | Admit: 2017-01-21 | Discharge: 2017-01-21 | Disposition: A | Discharge status: Home / Self Care | Payer: BLUE CROSS/BLUE SHIELD | Attending: Family Medicine | Admitting: Family Medicine

## 2017-01-21 ENCOUNTER — Encounter (HOSPITAL_COMMUNITY): Payer: Self-pay | Admitting: Emergency Medicine

## 2017-01-21 DIAGNOSIS — S52502A Unspecified fracture of the lower end of left radius, initial encounter for closed fracture: Secondary | ICD-10-CM | POA: Diagnosis not present

## 2017-01-21 DIAGNOSIS — W11XXXA Fall on and from ladder, initial encounter: Secondary | ICD-10-CM | POA: Diagnosis not present

## 2017-01-21 NOTE — ED Notes (Signed)
Spoke with [employer]

## 2017-01-21 NOTE — Discharge Instructions (Signed)
You may use over the counter ibuprofen or acetaminophen as needed.  ° °

## 2017-01-21 NOTE — ED Triage Notes (Signed)
Pt sts left wrist injury with some swelling noted after falling from step ladder today

## 2017-01-21 NOTE — Progress Notes (Signed)
Orthopedic Tech Progress Note Patient Details:  LAMEEKA SCHLEIFER 1965-11-21 [id number]  Ortho Devices Type of Ortho Device: Ace wrap, Volar splint Ortho Device/Splint Location: LUE Ortho Device/Splint Interventions: Ordered, Application   Post Interventions Patient Tolerated: Well Instructions Provided: Care of device   Braulio Bosch 01/21/2017, 3:58 PM

## 2017-01-22 MED FILL — HYDROCODON-APAP 5-325: 5-325 | 5 days supply | Qty: 10 | Fill #0

## 2017-01-22 NOTE — ED Provider Notes (Signed)
[facility]   [id number] 01/21/17 Arrival Time: [place] PLAN:  1. Closed fracture of distal end of left radius, unspecified fracture morphology, initial encounter    Prefers OTC analgesics. Orthopaedic tech applied short arm splint. Recommend orthopaedic f/u within one week. May f/u here as needed. Reviewed expectations re: course of current medical issues. Questions answered. Outlined signs and symptoms indicating need for more acute intervention. Patient verbalized understanding. After Visit Summary given.   SUBJECTIVE:  Andrea Lynn is a 51 y.o. female who presents with complaint of injury to her L wrist. Slipped off of step ladder today falling onto her L arm/wrist. Immediate discomfort of L wrist. Decreased ROM secondary to discomfort. Certain movements exacerbate. No extremity sensation changes or weakness. No OTC treatment.  ROS: As per HPI.   OBJECTIVE:  Vitals:   01/21/17 1428  BP: (!) 165/102  Pulse: (!) 102  Resp: 18  Temp: 98.3 F (36.8 C)  TempSrc: Oral  SpO2: 100%    General appearance: alert; no distress Extremities: L wrist with tenderness over radius; mild swelling; no gross deformity Skin: warm and dry Neurologic: normal gait; normal symmetric reflexes Psychological: alert and cooperative; normal mood and affect  Imaging: Dg Wrist Complete Left  Result Date: 01/21/2017 CLINICAL DATA:  Pain secondary to a fall from a ladder today. EXAM: LEFT WRIST - COMPLETE 3+ VIEW COMPARISON:  None. FINDINGS: There is a comminuted dorsally impacted and slightly dorsally displaced fracture of the distal left radius. The fracture involves the articular surface of the distal radius. The carpal bones and ulna are intact. IMPRESSION: Comminuted distal left radius fracture as described. Electronically Signed   By: Lorriane Shire M.D.   On: 01/21/2017 15:27    No Known Allergies  Past Medical History:  Diagnosis Date  . Anemia    prior to  fibroid removal  . Fibroid   . HTN (hypertension) 2016  . Ovarian cyst    Social History   Socioeconomic History  . Marital status: Married    Spouse name: Not on file  . Number of children: Not on file  . Years of education: Not on file  . Highest education level: Not on file  Social Needs  . Financial resource strain: Not on file  . Food insecurity - worry: Not on file  . Food insecurity - inability: Not on file  . Transportation needs - medical: Not on file  . Transportation needs - non-medical: Not on file  Occupational History  . Not on file  Tobacco Use  . Smoking status: Never Smoker  . Smokeless tobacco: Never Used  Substance and Sexual Activity  . Alcohol use: No  . Drug use: No  . Sexual activity: Yes    Birth control/protection: Post-menopausal  Other Topics Concern  . Not on file  Social History Narrative   ** Merged History Encounter **       Family History  Problem Relation Age of Onset  . Hypertension Paternal Grandmother   . Hypertension Paternal Grandfather   . Hypertension Father   . Tongue cancer Mother   . Breast cancer Cousin   . Breast cancer Cousin   . Colon cancer Neg Hx   . Colon polyps Neg Hx   . Esophageal cancer Neg Hx   . Rectal cancer Neg Hx   . Stomach cancer Neg Hx    Past Surgical History:  Procedure Laterality Date  . MYOMECTOMY    . OVARIAN CYST SURGERY    .  PELVIC LAPAROSCOPY     EXPL. LAP  . TOOTH EXTRACTION       Vanessa Kick, MD 01/22/17 [phone]

## 2017-01-23 ENCOUNTER — Encounter (HOSPITAL_COMMUNITY): Payer: Self-pay | Admitting: *Deleted

## 2017-01-23 ENCOUNTER — Other Ambulatory Visit: Payer: Self-pay

## 2017-01-23 NOTE — H&P (Signed)
Judianne SAHRA Lynn is an 51 y.o. female.   Chief Complaint: LEFT WRIST PAIN  HPI: THE PATIENT IS A 51 Y/O RIGHT HAND DOMINANT FEMALE WHO INJURED THE LEFT WRIST AFTER A FALL ON 01/21/17. THE PATIENT WAS SEEN AT AN URGENT CARE WHERE SHE WAS PUT INTO A SPLINT.  SHE WAS SEEN IN OUR OFFICE FOR FURTHER EVALUATION. DISCUSSED THE REASON AND RATIONALE FOR SURGERY AND THE USE OF A PLATE AND SCREWS FOR INTERNAL FIXATION. DISCUSSED THE SURGICAL PROCEDURE, INCLUDING THE RISKS VERSUS BENEFIT, AND THE POSTOPERATIVE RECOVERY. THE PATIENT WILL REMAIN IN THE SPLINT UNTIL THE TIME OF SURGERY. THE PATIENT IS HERE TODAY FOR SURGERY  Past Medical History:  Diagnosis Date  . Anemia    prior to fibroid removal  . Fibroid   . HTN (hypertension) 2016  . Ovarian cyst     Past Surgical History:  Procedure Laterality Date  . MYOMECTOMY    . OVARIAN CYST SURGERY    . PELVIC LAPAROSCOPY     EXPL. LAP  . TOOTH EXTRACTION      Family History  Problem Relation Age of Onset  . Hypertension Paternal Grandmother   . Hypertension Paternal Grandfather   . Hypertension Father   . Tongue cancer Mother   . Breast cancer Cousin   . Breast cancer Cousin   . Colon cancer Neg Hx   . Colon polyps Neg Hx   . Esophageal cancer Neg Hx   . Rectal cancer Neg Hx   . Stomach cancer Neg Hx    Social History:  reports that  has never smoked. she has never used smokeless tobacco. She reports that she does not drink alcohol or use drugs.  Allergies: No Known Allergies  No medications prior to admission.    No results found for this or any previous visit (from the past 48 hour(s)). Dg Wrist Complete Left  Result Date: 01/21/2017 CLINICAL DATA:  Pain secondary to a fall from a ladder today. EXAM: LEFT WRIST - COMPLETE 3+ VIEW COMPARISON:  None. FINDINGS: There is a comminuted dorsally impacted and slightly dorsally displaced fracture of the distal left radius. The fracture involves the articular surface of the distal radius.  The carpal bones and ulna are intact. IMPRESSION: Comminuted distal left radius fracture as described. Electronically Signed   By: Lorriane Shire M.D.   On: 01/21/2017 15:27    ROS NO RECENT ILLNESSES OR HOSPITALIZATIONS  Last menstrual period 04/07/2010. Physical Exam  General Appearance:  Alert, cooperative, no distress, appears stated age  Head:  Normocephalic, without obvious abnormality, atraumatic  Eyes:  Pupils equal, conjunctiva/corneas clear,         Throat: Lips, mucosa, and tongue normal; teeth and gums normal  Neck: No visible masses     Lungs:   respirations unlabored  Chest Wall:  No tenderness or deformity  Heart:  Regular rate and rhythm,  Abdomen:   Soft, non-tender,         Extremities: LUE: SPLINT IS CLEAN, DRY, AND INTACT. GENERALIZED TENDERNESS THROUGHOUT THE WRIST. MODERATE SWELLING WITH NO OPEN WOUNDS OR ERYTHEMA. CAPILLARY REFILL LESS THAN 2 SECONDS. SENSATION INTACT TO LIGHT TOUCH. ABLE TO WIGGLE ALL FINGERS.  Pulses: 2+ and symmetric  Skin: Skin color, texture, turgor normal, no rashes or lesions     Neurologic: Normal    Assessment LEFT WRIST DISPLACED DISTAL RADIUS FRACTURE  Plan LEFT WRIST OPEN REDUCTION AND INTERNAL FIXATION WITH REPAIR AS INDICATED R/B/A DISCUSSED WITH PT IN OFFICE.  PT VOICED UNDERSTANDING  OF PLAN CONSENT SIGNED DAY OF SURGERY PT SEEN AND EXAMINED PRIOR TO OPERATIVE PROCEDURE/DAY OF SURGERY SITE MARKED. QUESTIONS ANSWERED WILL GO HOME FOLLOWING SURGERY  WE ARE PLANNING SURGERY FOR YOUR UPPER EXTREMITY. THE RISKS AND BENEFITS OF SURGERY INCLUDE BUT NOT LIMITED TO BLEEDING INFECTION, DAMAGE TO NEARBY NERVES ARTERIES TENDONS, FAILURE OF SURGERY TO ACCOMPLISH ITS INTENDED GOALS, PERSISTENT SYMPTOMS AND NEED FOR FURTHER SURGICAL INTERVENTION. WITH THIS IN MIND WE WILL PROCEED. I HAVE DISCUSSED WITH THE PATIENT THE PRE AND POSTOPERATIVE REGIMEN AND THE DOS AND DON'TS. PT VOICED UNDERSTANDING AND INFORMED CONSENT  SIGNED.    Brynda Peon 01/23/2017, 8:52 AM

## 2017-01-23 NOTE — Progress Notes (Signed)
Spoke with pt for pre-op call. Pt denies cardiac history, chest pain or sob. Pt states she is not diabetic.  

## 2017-01-24 ENCOUNTER — Ambulatory Visit (HOSPITAL_COMMUNITY): Payer: BLUE CROSS/BLUE SHIELD | Admitting: Anesthesiology

## 2017-01-24 ENCOUNTER — Ambulatory Visit (HOSPITAL_COMMUNITY)
Admission: RE | Admit: 2017-01-24 | Discharge: 2017-01-24 | Disposition: A | Discharge status: Home / Self Care | Payer: BLUE CROSS/BLUE SHIELD | Source: Ambulatory Visit | Attending: Orthopedic Surgery | Admitting: Orthopedic Surgery

## 2017-01-24 ENCOUNTER — Encounter (HOSPITAL_COMMUNITY)
Admission: RE | Disposition: A | Discharge status: Home / Self Care | Payer: Self-pay | Source: Ambulatory Visit | Attending: Orthopedic Surgery

## 2017-01-24 ENCOUNTER — Encounter (HOSPITAL_COMMUNITY): Payer: Self-pay | Admitting: *Deleted

## 2017-01-24 DIAGNOSIS — W11XXXA Fall on and from ladder, initial encounter: Secondary | ICD-10-CM | POA: Diagnosis not present

## 2017-01-24 DIAGNOSIS — I10 Essential (primary) hypertension: Secondary | ICD-10-CM | POA: Diagnosis not present

## 2017-01-24 DIAGNOSIS — Z8249 Family history of ischemic heart disease and other diseases of the circulatory system: Secondary | ICD-10-CM | POA: Insufficient documentation

## 2017-01-24 DIAGNOSIS — Z800 Family history of malignant neoplasm of digestive organs: Secondary | ICD-10-CM | POA: Diagnosis not present

## 2017-01-24 DIAGNOSIS — S52502A Unspecified fracture of the lower end of left radius, initial encounter for closed fracture: Secondary | ICD-10-CM

## 2017-01-24 DIAGNOSIS — Z803 Family history of malignant neoplasm of breast: Secondary | ICD-10-CM | POA: Insufficient documentation

## 2017-01-24 DIAGNOSIS — S52572A Other intraarticular fracture of lower end of left radius, initial encounter for closed fracture: Secondary | ICD-10-CM | POA: Diagnosis not present

## 2017-01-24 HISTORY — PX: OPEN REDUCTION INTERNAL FIXATION (ORIF) DISTAL RADIAL FRACTURE: SHX5989

## 2017-01-24 LAB — BASIC METABOLIC PANEL
Anion gap: 9 (ref 5–15)
BUN: 5 mg/dL — ABNORMAL LOW (ref 6–20)
CO2: 24 mmol/L (ref 22–32)
Calcium: 9 mg/dL (ref 8.9–10.3)
Chloride: 103 mmol/L (ref 101–111)
Creatinine, Ser: 0.55 mg/dL (ref 0.44–1.00)
GFR calc Af Amer: >60 mL/min (ref >60)
GFR calc non Af Amer: >60 mL/min (ref >60)
Glucose, Bld: 95 mg/dL (ref 65–99)
Potassium: 3.7 mmol/L (ref 3.5–5.1)
Sodium: 136 mmol/L (ref 135–145)

## 2017-01-24 LAB — CBC
HCT: 39.6 % (ref 36.0–46.0)
Hemoglobin: 13 g/dL (ref 12.0–15.0)
MCH: 29 pg (ref 26.0–34.0)
MCHC: 32.8 g/dL (ref 30.0–36.0)
MCV: 88.2 fL (ref 78.0–100.0)
Platelets: 319 10*3/uL (ref 150–400)
RBC: 4.49 MIL/uL (ref 3.87–5.11)
RDW: 12.8 % (ref 11.5–15.5)
WBC: 5.8 10*3/uL (ref 4.0–10.5)

## 2017-01-24 SURGERY — OPEN REDUCTION INTERNAL FIXATION (ORIF) DISTAL RADIUS FRACTURE
Anesthesia: General | Site: Wrist | Laterality: Left

## 2017-01-24 MED ORDER — LIDOCAINE 2% (20 MG/ML) 5 ML SYRINGE
INTRAMUSCULAR | Status: AC
  Filled 2017-01-24: qty 5

## 2017-01-24 MED ORDER — DEXAMETHASONE SODIUM PHOSPHATE 10 MG/ML IJ SOLN
INTRAMUSCULAR | Status: AC
  Filled 2017-01-24: qty 1

## 2017-01-24 MED ORDER — PHENYLEPHRINE 40 MCG/ML (10ML) SYRINGE FOR IV PUSH (FOR BLOOD PRESSURE SUPPORT)
PREFILLED_SYRINGE | INTRAVENOUS | Status: AC
  Filled 2017-01-24: qty 10

## 2017-01-24 MED ORDER — FENTANYL CITRATE (PF) 100 MCG/2ML IJ SOLN
INTRAMUSCULAR | Status: DC | PRN
Start: 2017-01-24 — End: 2017-01-24
  Administered 2017-01-24 (×2): 25 ug via INTRAVENOUS
  Administered 2017-01-24: 50 ug via INTRAVENOUS

## 2017-01-24 MED ORDER — LACTATED RINGERS IV SOLN
INTRAVENOUS | Status: DC | PRN
  Administered 2017-01-24 (×2): via INTRAVENOUS

## 2017-01-24 MED ORDER — BUPIVACAINE-EPINEPHRINE (PF) 0.5% -1:200000 IJ SOLN
INTRAMUSCULAR | Status: DC | PRN
  Administered 2017-01-24: 30 mL via PERINEURAL

## 2017-01-24 MED ORDER — CEFAZOLIN SODIUM-DEXTROSE 2-4 GM/100ML-% IV SOLN
2.0000 g | INTRAVENOUS | Status: AC
  Administered 2017-01-24: 2 g via INTRAVENOUS

## 2017-01-24 MED ORDER — ONDANSETRON HCL 4 MG/2ML IJ SOLN
INTRAMUSCULAR | Status: AC
  Filled 2017-01-24: qty 2

## 2017-01-24 MED ORDER — PROMETHAZINE HCL 25 MG/ML IJ SOLN: 6.2500 mg | INTRAMUSCULAR | Status: DC | PRN

## 2017-01-24 MED ORDER — CHLORHEXIDINE GLUCONATE 4 % EX LIQD: 60.0000 mL | Freq: Once | CUTANEOUS | Status: DC

## 2017-01-24 MED ORDER — PROPOFOL 10 MG/ML IV BOLUS
INTRAVENOUS | Status: AC
  Filled 2017-01-24: qty 20

## 2017-01-24 MED ORDER — EPHEDRINE 5 MG/ML INJ
INTRAVENOUS | Status: AC
  Filled 2017-01-24: qty 10

## 2017-01-24 MED ORDER — LIDOCAINE 2% (20 MG/ML) 5 ML SYRINGE
INTRAMUSCULAR | Status: DC | PRN
Start: 2017-01-24 — End: 2017-01-24
  Administered 2017-01-24: 20 mg via INTRAVENOUS

## 2017-01-24 MED ORDER — PHENYLEPHRINE HCL 10 MG/ML IJ SOLN
INTRAMUSCULAR | Status: DC | PRN
  Administered 2017-01-24 (×2): 80 ug via INTRAVENOUS

## 2017-01-24 MED ORDER — LACTATED RINGERS IV SOLN
INTRAVENOUS | Status: DC
  Administered 2017-01-24: 11:00:00 via INTRAVENOUS

## 2017-01-24 MED ORDER — 0.9 % SODIUM CHLORIDE (POUR BTL) OPTIME
TOPICAL | Status: DC | PRN
  Administered 2017-01-24: 1000 mL

## 2017-01-24 MED ORDER — PROPOFOL 10 MG/ML IV BOLUS
INTRAVENOUS | Status: DC | PRN
  Administered 2017-01-24: 200 mg via INTRAVENOUS

## 2017-01-24 MED ORDER — FENTANYL CITRATE (PF) 250 MCG/5ML IJ SOLN
INTRAMUSCULAR | Status: AC
  Filled 2017-01-24: qty 5

## 2017-01-24 MED ORDER — MIDAZOLAM HCL 2 MG/2ML IJ SOLN
INTRAMUSCULAR | Status: AC
  Filled 2017-01-24: qty 2

## 2017-01-24 MED ORDER — DEXAMETHASONE SODIUM PHOSPHATE 4 MG/ML IJ SOLN
INTRAMUSCULAR | Status: DC | PRN
  Administered 2017-01-24: 10 mg via INTRAVENOUS

## 2017-01-24 MED ORDER — FENTANYL CITRATE (PF) 100 MCG/2ML IJ SOLN: 25.0000 ug | INTRAMUSCULAR | Status: DC | PRN

## 2017-01-24 MED ORDER — MIDAZOLAM HCL 5 MG/5ML IJ SOLN
INTRAMUSCULAR | Status: DC | PRN
  Administered 2017-01-24: 2 mg via INTRAVENOUS

## 2017-01-24 MED ORDER — ONDANSETRON HCL 4 MG/2ML IJ SOLN
INTRAMUSCULAR | Status: DC | PRN
  Administered 2017-01-24: 4 mg via INTRAVENOUS

## 2017-01-24 MED ORDER — CEFAZOLIN SODIUM-DEXTROSE 2-4 GM/100ML-% IV SOLN
INTRAVENOUS | Status: AC
  Filled 2017-01-24: qty 100

## 2017-01-24 SURGICAL SUPPLY — 66 items
BANDAGE ACE 3X5.8 VEL STRL LF (GAUZE/BANDAGES/DRESSINGS) ×2 IMPLANT
BANDAGE ACE 4X5 VEL STRL LF (GAUZE/BANDAGES/DRESSINGS) ×2 IMPLANT
BIT DRILL 2.2 SS TIBIAL (BIT) ×1 IMPLANT
BIT DRILL 2.9 (BIT) ×1
BIT DRILL DVR 2.9 (BIT) IMPLANT
BLADE CLIPPER SURG (BLADE) IMPLANT
BNDG CMPR 9X4 STRL LF SNTH (GAUZE/BANDAGES/DRESSINGS) ×1
BNDG ESMARK 4X9 LF (GAUZE/BANDAGES/DRESSINGS) ×2 IMPLANT
BNDG GAUZE ELAST 4 BULKY (GAUZE/BANDAGES/DRESSINGS) ×2 IMPLANT
CANISTER SUCT 3000ML PPV (MISCELLANEOUS) ×2 IMPLANT
CORDS BIPOLAR (ELECTRODE) ×2 IMPLANT
COVER SURGICAL LIGHT HANDLE (MISCELLANEOUS) ×2 IMPLANT
CUFF TOURNIQUET SINGLE 18IN (TOURNIQUET CUFF) ×2 IMPLANT
CUFF TOURNIQUET SINGLE 24IN (TOURNIQUET CUFF) IMPLANT
DECANTER SPIKE VIAL GLASS SM (MISCELLANEOUS) ×1 IMPLANT
DRAPE OEC MINIVIEW 54X84 (DRAPES) ×2 IMPLANT
DRAPE SURG 17X11 SM STRL (DRAPES) ×2 IMPLANT
DRILL BIT 2.9MM (BIT) ×2
DRSG ADAPTIC 3X8 NADH LF (GAUZE/BANDAGES/DRESSINGS) ×2 IMPLANT
GAUZE SPONGE 4X4 12PLY STRL (GAUZE/BANDAGES/DRESSINGS) ×1 IMPLANT
GAUZE SPONGE 4X4 12PLY STRL LF (GAUZE/BANDAGES/DRESSINGS) ×1 IMPLANT
GAUZE SPONGE 4X4 16PLY XRAY LF (GAUZE/BANDAGES/DRESSINGS) ×1 IMPLANT
GAUZE XEROFORM 5X9 LF (GAUZE/BANDAGES/DRESSINGS) ×1 IMPLANT
GLOVE BIOGEL PI IND STRL 8.5 (GLOVE) ×1 IMPLANT
GLOVE BIOGEL PI INDICATOR 8.5 (GLOVE) ×1
GLOVE SURG ORTHO 8.0 STRL STRW (GLOVE) ×2 IMPLANT
GOWN STRL REUS W/ TWL LRG LVL3 (GOWN DISPOSABLE) ×1 IMPLANT
GOWN STRL REUS W/ TWL XL LVL3 (GOWN DISPOSABLE) ×1 IMPLANT
GOWN STRL REUS W/TWL LRG LVL3 (GOWN DISPOSABLE) ×2
GOWN STRL REUS W/TWL XL LVL3 (GOWN DISPOSABLE) ×2
KIT BASIN OR (CUSTOM PROCEDURE TRAY) ×2 IMPLANT
KIT ROOM TURNOVER OR (KITS) ×2 IMPLANT
NDL HYPO 25X1 1.5 SAFETY (NEEDLE) ×1 IMPLANT
NEEDLE HYPO 25X1 1.5 SAFETY (NEEDLE) IMPLANT
NS IRRIG 1000ML POUR BTL (IV SOLUTION) ×2 IMPLANT
PACK ORTHO EXTREMITY (CUSTOM PROCEDURE TRAY) ×2 IMPLANT
PAD ARMBOARD 7.5X6 YLW CONV (MISCELLANEOUS) ×4 IMPLANT
PAD CAST 3X4 CTTN HI CHSV (CAST SUPPLIES) IMPLANT
PAD CAST 4YDX4 CTTN HI CHSV (CAST SUPPLIES) ×1 IMPLANT
PADDING CAST COTTON 3X4 STRL (CAST SUPPLIES) ×2
PADDING CAST COTTON 4X4 STRL (CAST SUPPLIES) ×4
PEG LOCKING SMOOTH 2.2X18 (Peg) ×2 IMPLANT
PEG LOCKING SMOOTH 2.2X20 (Screw) ×2 IMPLANT
PEG LOCKING SMOOTH 2.2X22 (Screw) ×2 IMPLANT
PLATE STANDARD DVR LEFT (Plate) ×2 IMPLANT
PLATE STD DVR LT 24X51 (Plate) IMPLANT
SCREW LOCK 14X2.7X 3 LD TPR (Screw) IMPLANT
SCREW LOCKING 2.7X13MM (Screw) ×1 IMPLANT
SCREW LOCKING 2.7X14 (Screw) ×4 IMPLANT
SCREW LOCKING 2.7X15MM (Screw) ×2 IMPLANT
SCREW MULTI DIRECTIONAL 2.7X18 (Screw) ×1 IMPLANT
SOAP 2 % CHG 4 OZ (WOUND CARE) ×2 IMPLANT
SPLINT FIBERGLASS 3X35 (CAST SUPPLIES) ×1 IMPLANT
SPLINT FIBERGLASS 4X30 (CAST SUPPLIES) ×1 IMPLANT
SPONGE LAP 4X18 X RAY DECT (DISPOSABLE) ×1 IMPLANT
SUT PROLENE 4 0 PS 2 18 (SUTURE) ×2 IMPLANT
SUT VIC AB 2-0 CT1 27 (SUTURE)
SUT VIC AB 2-0 CT1 TAPERPNT 27 (SUTURE) IMPLANT
SUT VIC AB 2-0 FS1 27 (SUTURE) ×1 IMPLANT
SUT VICRYL 4-0 PS2 18IN ABS (SUTURE) ×1 IMPLANT
SYR CONTROL 10ML LL (SYRINGE) IMPLANT
TOWEL OR 17X24 6PK STRL BLUE (TOWEL DISPOSABLE) ×2 IMPLANT
TOWEL OR 17X26 10 PK STRL BLUE (TOWEL DISPOSABLE) ×2 IMPLANT
TUBE CONNECTING 12X1/4 (SUCTIONS) ×2 IMPLANT
WATER STERILE IRR 1000ML POUR (IV SOLUTION) ×1 IMPLANT
YANKAUER SUCT BULB TIP NO VENT (SUCTIONS) IMPLANT

## 2017-01-24 NOTE — Anesthesia Preprocedure Evaluation (Signed)
Anesthesia Evaluation  Patient identified by MRN, date of birth, ID band Patient awake    Reviewed: Allergy & Precautions, NPO status , Patient's Chart, lab work & pertinent test results  Airway Mallampati: II  TM Distance: >3 FB Neck ROM: Full    Dental  (+) Dental Advisory Given   Pulmonary neg pulmonary ROS,    breath sounds clear to auscultation       Cardiovascular hypertension, Pt. on medications  Rhythm:Regular Rate:Normal     Neuro/Psych negative neurological ROS     GI/Hepatic negative GI ROS, Neg liver ROS,   Endo/Other  negative endocrine ROS  Renal/GU negative Renal ROS     Musculoskeletal   Abdominal   Peds  Hematology negative hematology ROS (+)   Anesthesia Other Findings   Reproductive/Obstetrics                             Anesthesia Physical Anesthesia Plan  ASA: II  Anesthesia Plan: General   Post-op Pain Management:  Regional for Post-op pain   Induction: Intravenous  PONV Risk Score and Plan: 3 and Ondansetron, Dexamethasone, Midazolam and Treatment may vary due to age or medical condition  Airway Management Planned: LMA  Additional Equipment:   Intra-op Plan:   Post-operative Plan: Extubation in OR  Informed Consent: I have reviewed the patients History and Physical, chart, labs and discussed the procedure including the risks, benefits and alternatives for the proposed anesthesia with the patient or authorized representative who has indicated his/her understanding and acceptance.   Dental advisory given  Plan Discussed with: CRNA  Anesthesia Plan Comments:         Anesthesia Quick Evaluation

## 2017-01-24 NOTE — Discharge Instructions (Signed)
KEEP BANDAGE CLEAN AND DRY CALL OFFICE FOR F/U APPT [id number] RX sent to pharmacy KEEP HAND ELEVATED ABOVE HEART OK TO APPLY ICE TO OPERATIVE AREA CONTACT OFFICE IF ANY WORSENING PAIN OR CONCERNS.

## 2017-01-24 NOTE — Anesthesia Procedure Notes (Signed)
Procedure Name: LMA Insertion Date/Time: 01/24/2017 11:41 AM Performed by: Inda Coke, CRNA Pre-anesthesia Checklist: Patient identified, Emergency Drugs available, Suction available and Patient being monitored Patient Re-evaluated:Patient Re-evaluated prior to induction Oxygen Delivery Method: Circle System Utilized Preoxygenation: Pre-oxygenation with 100% oxygen Induction Type: IV induction Ventilation: Mask ventilation without difficulty LMA: LMA inserted LMA Size: 4.0 Number of attempts: 1 Airway Equipment and Method: Bite block Placement Confirmation: positive ETCO2 and breath sounds checked- equal and bilateral Tube secured with: Tape Dental Injury: Teeth and Oropharynx as per pre-operative assessment

## 2017-01-24 NOTE — Op Note (Signed)
PREOPERATIVE DIAGNOSIS: Left wrist intra-articular distal radius fracture 3 more fragments  POSTOPERATIVE DIAGNOSIS: Same  ATTENDING SURGEON: Dr. Gavin Pound who was scrubbed and present for the entire procedure  ASSISTANT SURGEON: None  ANESTHESIA: Axillary block with sedation  OPERATIVE PROCEDURE: #1: Open treatment of left wrist intra-articular distal radius fracture 3 more fragments #2: Left wrist brachia radialis tendon tenotomy and release #3: Radiographs 3 views left wrist  IMPLANTS: [employer]omet DVR cross lock standard plate  RADIOGRAPHIC INTERPRETATION: AP lateral and oblique views of the wrist to show the volar plate fixation place with good alignment  SURGICAL INDICATIONS: Andrea Lynn is a right-hand-dominant female who sustained an intra-articular distal radius fracture after a fall. The patient was seen and evaluated in the office and based on the degree of displacement it was recommended she undergo the above procedure. Risks benefits and alternatives were discussed in detail with the patient. A signed informed consent was obtained. Risks include but not limited to bleeding infection damage to nearby nerves arteries or tendons nonunion malunion hardware failure loss of motion of the elbow wrists and digits and need for further surgical intervention.  SURGICAL TECHNIQUE: The patient is properly identified in the preoperative holding area and a mark with a permanent marker made on the left wrist indicate correct operative site. Patient tolerated the block performed by anesthesia. Patient is then brought back to the operating room. Patient received preoperative antibiotics prior any skin incision. A light sedation was administered. A well-padded tourniquet was then placed on the left brachium and sealed with the appropriate drape. Left upper extremity was then prepped and draped in normal sterile fashion. A timeout was called the correct site was identified and the procedure was  then begun. The limited bit elevated and using Esmarch exsanguination the tourniquet insufflated. A longitudinal incision made directly over the FCR sheath. Dissection was then carried down through the skin and subcutaneous tissue. The FCR sheath was then opened proximally and distally. Careful dissection was done going through the floor the FCR sheath with the FPL was identified. An L-shaped pronator quadratus flap was then elevated.  In order to obtain reduction of the radial column the brachial radialis was then carefully released off the radial styloid. Protection of the first dorsal compartment tendons were then done. Tendon tenotomy was then done which allowed reduction and visualization of the radial column. The fracture site was then opened. The patient did have the metaphyseal comminution. The patient did have the greater than 3 part intra-articular distal radius fracture. Open reduction was then achieved. Once this was done the volar plate was then applied and held distally with a K wire. The oblong screw hole was then placed with a 2.55mm drill bit proximally in the appropriate screw. Distal fixation was then carried out from an ulnar to radial direction of the distal locking pegs. Final shaft screw fixation was then achieved. 1 multidirectional screw was used and the most ulnar proximal hole to avoid any type of penetration into the distal radioulnar joint. The wound was then thoroughly irrigated. Final radiographs were then carried out. The pronator quadratus was closed with 2-0 Vicryl suture. Subcutaneous tissues closed with 4-0 Vicryl. Skin was then closed using simple Prolene sutures. Adaptic dressing and a sterile compressive bandage then applied. Patient was then placed in a well-padded sugar tong splint taken recovery room in good condition.  POSTOPERATIVE PLAN: Patient will be discharged to home seen back in the office in approximately 2 weeks for wound check suture removal x-rays  application  of a short arm cast place the outpatient therapy order to begin at the four-week mark short arm cast for a total of 4 weeks radiographs at each visit.

## 2017-01-24 NOTE — Anesthesia Procedure Notes (Signed)
Anesthesia Regional Block: Axillary brachial plexus block   Pre-Anesthetic Checklist: ,, timeout performed, Correct Patient, Correct Site, Correct Laterality, Correct Procedure, Correct Position, site marked, Risks and benefits discussed,  Surgical consent,  Pre-op evaluation,  At surgeon's request and post-op pain management  Laterality: Left  Prep: chloraprep       Needles:  Injection technique: Single-shot  Needle Type: Echogenic Stimulator Needle     Needle Length: 9cm  Needle Gauge: 21     Additional Needles:   Procedures:, nerve stimulator,,, ultrasound used (permanent image in chart),,,,   Nerve Stimulator or Paresthesia:  Response: biceps, wrist flexion and extension., 0.5 mA,   Additional Responses:   Narrative:  Start time: 01/24/2017 11:20 AM End time: 01/24/2017 11:27 AM Injection made incrementally with aspirations every 5 mL.  Performed by: Personally  Anesthesiologist: Suzette Battiest, MD

## 2017-01-24 NOTE — Progress Notes (Signed)
Patient state she is about 10 minutes away from hospital

## 2017-01-24 NOTE — Anesthesia Postprocedure Evaluation (Signed)
Anesthesia Post Note  Patient: Andrea Lynn  Procedure(s) Performed: OPEN REDUCTION INTERNAL FIXATION (ORIF) DISTAL RADIAL FRACTURE (Left Wrist)     Patient location during evaluation: PACU Anesthesia Type: General Level of consciousness: awake and alert Pain management: pain level controlled Vital Signs Assessment: post-procedure vital signs reviewed and stable Respiratory status: spontaneous breathing, nonlabored ventilation, respiratory function stable and patient connected to nasal cannula oxygen Cardiovascular status: blood pressure returned to baseline and stable Postop Assessment: no apparent nausea or vomiting Anesthetic complications: no    Last Vitals:  Vitals:   01/24/17 1253 01/24/17 1308  BP: (!) 144/88 (!) 136/91  Pulse: 98 94  Resp: 11 15  Temp:    SpO2: 100% 100%    Last Pain:  Vitals:   01/24/17 1238  TempSrc:   PainSc: 0-No pain                 Tiajuana Amass

## 2017-01-24 NOTE — Transfer of Care (Signed)
Immediate Anesthesia Transfer of Care Note  Patient: Andrea Lynn  Procedure(s) Performed: OPEN REDUCTION INTERNAL FIXATION (ORIF) DISTAL RADIAL FRACTURE (Left Wrist)  Patient Location: PACU  Anesthesia Type:GA combined with regional for post-op pain  Level of Consciousness: awake, alert  and oriented  Airway & Oxygen Therapy: Patient Spontanous Breathing and Patient connected to nasal cannula oxygen  Post-op Assessment: Report given to RN and Post -op Vital signs reviewed and stable  Post vital signs: Reviewed and stable  Last Vitals:  Vitals:   01/24/17 1034  BP: (!) (P) 168/92  Pulse: (!) (P) 105  Resp: (P) 18  Temp: (P) 36.8 C  SpO2: (P) 99%    Last Pain:  Vitals:   01/24/17 1034  TempSrc: (P) Oral  PainSc:       Patients Stated Pain Goal: 4 (49/75/30 0511)  Complications: No apparent anesthesia complications

## 2017-01-29 MED FILL — METHOCARBAMOL 500 MG TABS: 500 | 7 days supply | Qty: 30 | Fill #0

## 2017-01-30 ENCOUNTER — Encounter (HOSPITAL_COMMUNITY): Payer: Self-pay | Admitting: Orthopedic Surgery

## 2017-02-04 ENCOUNTER — Encounter (HOSPITAL_COMMUNITY): Payer: Self-pay | Admitting: Orthopedic Surgery

## 2017-04-03 MED FILL — DICLOFENAC SODIUM 1% GEL: 1 | 6 days supply | Qty: 200 | Fill #0

## 2017-04-03 MED FILL — CYCLOBENZAPRINE 5 MG TABLET: 5 | 10 days supply | Qty: 30 | Fill #2

## 2017-04-03 MED FILL — IRBESARTAN 75 MG TABLET: 75 | 90 days supply | Qty: 90 | Fill #3

## 2017-06-02 MED FILL — DICLOFENAC SODIUM 1% GEL: 1 | 16 days supply | Qty: 500 | Fill #0

## 2017-08-05 MED FILL — IRBESARTAN 75 MG TABLET: 75 | 30 days supply | Qty: 30 | Fill #0

## 2017-09-03 MED FILL — IRBESARTAN 75 MG TABLET: 75 | 90 days supply | Qty: 90 | Fill #1

## 2017-11-10 MED FILL — ROSUVASTATIN CALCIUM 10 MG: 10 | 30 days supply | Qty: 30 | Fill #0

## 2017-12-09 IMAGING — DX DG WRIST COMPLETE 3+V*L*
4 series · 4 of 4 positions shown · non-contrast
Comparison: None.

CLINICAL DATA: Pain secondary to a fall from a ladder today.

EXAM:
LEFT WRIST - COMPLETE 3+ VIEW

[wrist pa]
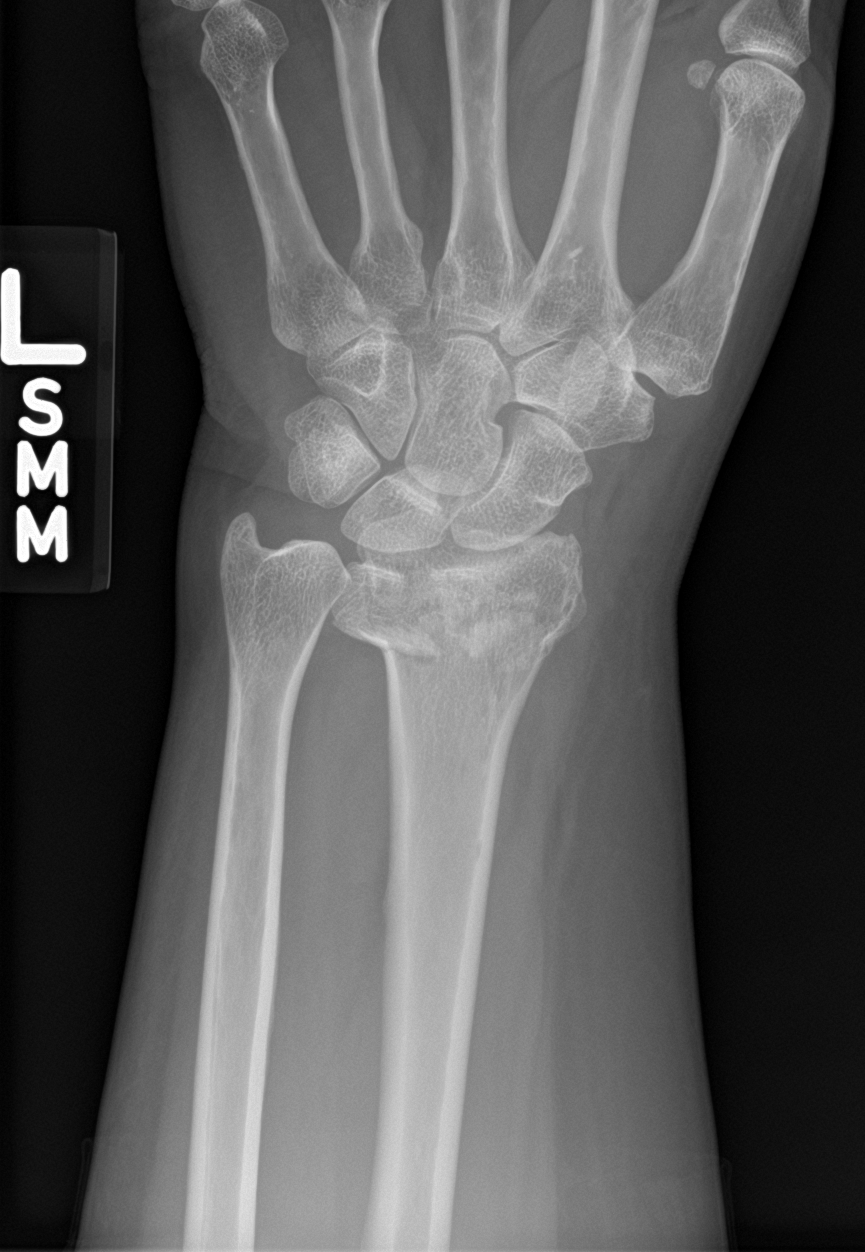

[wrist navicular]
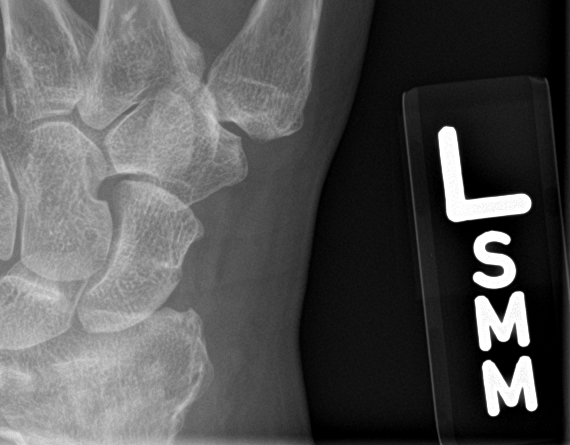

[wrist obl]
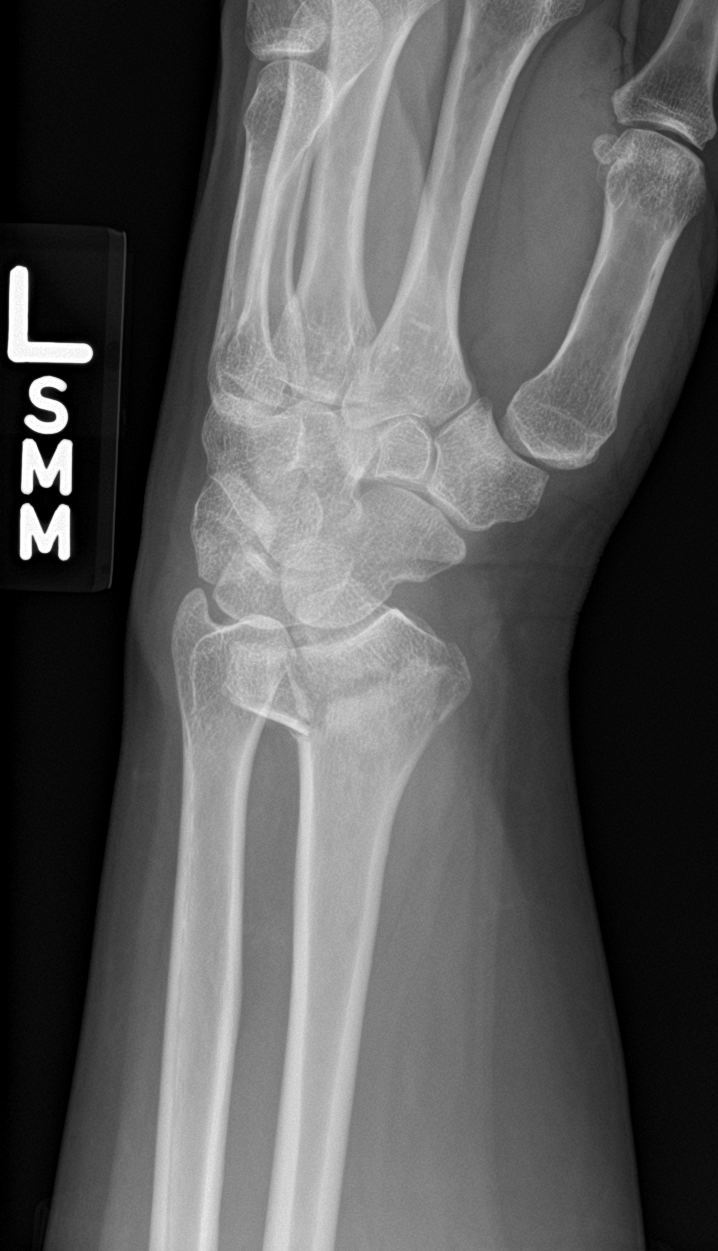

[wrist lat]
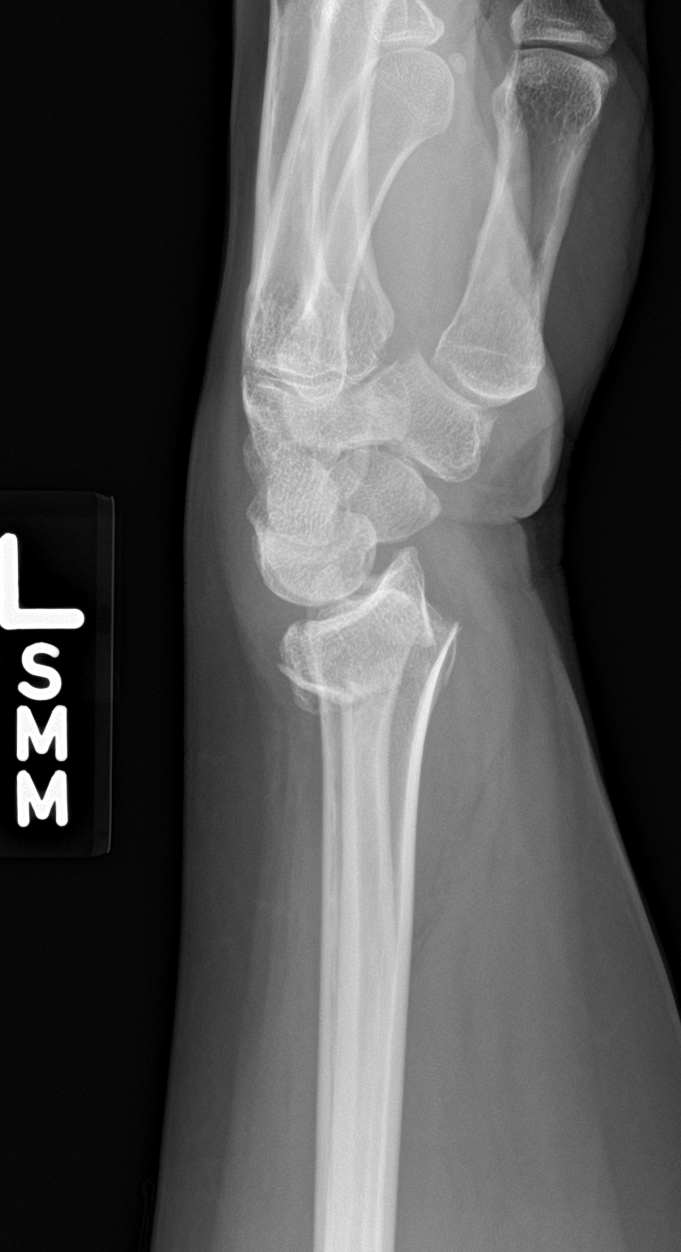

[4 of 4 positions shown; findings below may reference images not displayed]

FINDINGS: There is a comminuted dorsally impacted and slightly dorsally
displaced fracture of the distal left radius. The fracture involves
the articular surface of the distal radius. The carpal bones and
ulna are intact.
IMPRESSION: Comminuted distal left radius fracture as described.

## 2017-12-16 MED FILL — IRBESARTAN 75 MG TABLET: 75 | 30 days supply | Qty: 30 | Fill #2

## 2017-12-16 MED FILL — ROSUVASTATIN CALCIUM 10 MG: 10 | 30 days supply | Qty: 30 | Fill #1

## 2018-01-21 MED FILL — IRBESARTAN 75 MG TABLET: 75 | 30 days supply | Qty: 30 | Fill #3

## 2018-01-21 MED FILL — ROSUVASTATIN CALCIUM 10 MG: 10 | 30 days supply | Qty: 30 | Fill #2

## 2018-01-28 ENCOUNTER — Ambulatory Visit (AMBULATORY_SURGERY_CENTER): Payer: Self-pay

## 2018-01-28 ENCOUNTER — Other Ambulatory Visit: Payer: Self-pay | Admitting: Gastroenterology

## 2018-01-28 ENCOUNTER — Other Ambulatory Visit: Payer: Self-pay

## 2018-01-28 VITALS — Ht 67.0 in | Wt 204.4 lb

## 2018-01-28 DIAGNOSIS — Z1211 Encounter for screening for malignant neoplasm of colon: Secondary | ICD-10-CM

## 2018-01-28 MED ORDER — NA SULFATE-K SULFATE-MG SULF 17.5-3.13-1.6 GM/177ML PO SOLN: 1.0000 | Freq: Once | ORAL | 0 refills | Status: AC

## 2018-01-28 NOTE — Progress Notes (Signed)
No egg or soy allergy known to patient  No issues with past sedation with any surgeries  or procedures, no intubation problems  No diet pills per patient No home 02 use per patient  No blood thinners per patient  Pt denies issues with constipation  No A fib or A flutter  EMMI video sent to pt's e mail  

## 2018-01-28 NOTE — Telephone Encounter (Signed)
Called [facility] outpatient pharmacy and insurance is not covering it. Given pharmacy no more than 50 coupon. Pharmacy will call the pt and let them know the price.

## 2018-02-04 ENCOUNTER — Encounter: Payer: BLUE CROSS/BLUE SHIELD | Admitting: Gastroenterology

## 2018-02-25 MED FILL — ROSUVASTATIN CALCIUM 10 MG: 10 | 30 days supply | Qty: 30 | Fill #3

## 2018-02-25 MED FILL — IRBESARTAN 75 MG TABLET: 75 | 90 days supply | Qty: 90 | Fill #0

## 2018-02-26 MED FILL — DICLOFENAC SODIUM 1 % GEL: 1 | 6 days supply | Qty: 200 | Fill #0

## 2018-05-25 MED FILL — IRBESARTAN 75 MG TABS: 75 | 90 days supply | Qty: 90 | Fill #1

## 2018-05-25 MED FILL — ROSUVASTATIN CALCIUM 10 MG: 10 | 30 days supply | Qty: 30 | Fill #4

## 2018-05-26 MED FILL — CYCLOBENZAPRINE HCL 5 MG TA: 5 | 10 days supply | Qty: 30 | Fill #0

## 2018-05-26 MED FILL — DICLOFENAC SODIUM 1 % GEL: 1 | 7 days supply | Qty: 100 | Fill #0

## 2018-07-24 MED FILL — CYCLOBENZAPRINE HCL 5 MG TA: 5 | 10 days supply | Qty: 30 | Fill #1

## 2018-07-24 MED FILL — DICLOFENAC SODIUM 1 % GEL: 1 | 7 days supply | Qty: 100 | Fill #1

## 2018-07-24 MED FILL — ROSUVASTATIN CALCIUM 10 MG: 10 | 90 days supply | Qty: 90 | Fill #0

## 2018-08-11 MED FILL — NAPROXEN 500 MG TABLET: 500 | 30 days supply | Qty: 60 | Fill #0

## 2019-02-03 MED FILL — CYCLOBENZAPRINE HCL 5 MG TA: 5 | 10 days supply | Qty: 30 | Fill #2

## 2019-02-03 MED FILL — IRBESARTAN 75 MG TABS: 75 | 90 days supply | Qty: 90 | Fill #0

## 2019-02-03 MED FILL — ROSUVASTATIN CALCIUM 10 MG: 10 | 90 days supply | Qty: 90 | Fill #1

## 2019-02-09 MED FILL — NAPROXEN 500 MG TABS: 500 | 30 days supply | Qty: 60 | Fill #1

## 2019-02-14 ENCOUNTER — Ambulatory Visit: Payer: BLUE CROSS/BLUE SHIELD | Attending: Internal Medicine

## 2019-02-14 DIAGNOSIS — Z20822 Contact with and (suspected) exposure to covid-19: Secondary | ICD-10-CM

## 2019-02-16 LAB — NOVEL CORONAVIRUS, NAA: SARS-CoV-2, NAA: NOT DETECTED

## 2019-03-18 MED FILL — NAPROXEN 500 MG TABS: 500 | 30 days supply | Qty: 60 | Fill #1

## 2019-03-18 MED FILL — CYCLOBENZAPRINE HCL 5 MG TA: 5 | 10 days supply | Qty: 30 | Fill #3

## 2019-04-25 ENCOUNTER — Encounter: Payer: Self-pay | Admitting: Gastroenterology

## 2019-05-12 ENCOUNTER — Other Ambulatory Visit: Payer: Self-pay

## 2019-05-12 ENCOUNTER — Ambulatory Visit (AMBULATORY_SURGERY_CENTER): Payer: Self-pay | Admitting: *Deleted

## 2019-05-12 VITALS — Temp 98.0°F | Ht 67.0 in | Wt 210.0 lb

## 2019-05-12 DIAGNOSIS — Z1211 Encounter for screening for malignant neoplasm of colon: Secondary | ICD-10-CM

## 2019-05-12 MED ORDER — SUPREP BOWEL PREP KIT 17.5-3.13-1.6 GM/177ML PO SOLN: 1.0000 | Freq: Once | ORAL | 0 refills | Status: AC

## 2019-05-12 MED FILL — SUPREP BOWEL PREP KIT: 17.5-3.13-1 | 1 days supply | Qty: 354 | Fill #0

## 2019-05-12 NOTE — Progress Notes (Signed)
05-08-2019 pt had [employer] covid vaccine- 1 dose completed series- no pre screen covid test needed   No egg or soy allergy known to patient  No issues with past sedation with any surgeries  or procedures, no intubation problems  No diet pills per patient No home 02 use per patient  No blood thinners per patient  Pt denies issues with constipation  No A fib or A flutter  EMMI video sent to pt's e mail   Due to the COVID-19 pandemic we are asking patients to follow these guidelines. Please only bring one care partner. Please be aware that your care partner may wait in the car in the parking lot or if they feel like they will be too hot to wait in the car, they may wait in the lobby on the 4th floor. All care partners are required to wear a mask the entire time (we do not have any that we can provide them), they need to practice social distancing, and we will do a Covid check for all patient's and care partners when you arrive. Also we will check their temperature and your temperature. If the care partner waits in their car they need to stay in the parking lot the entire time and we will call them on their cell phone when the patient is ready for discharge so they can bring the car to the front of the building. Also all patient's will need to wear a mask into building.

## 2019-05-26 ENCOUNTER — Encounter: Payer: Self-pay | Admitting: Gastroenterology

## 2019-05-26 ENCOUNTER — Other Ambulatory Visit: Payer: Self-pay

## 2019-05-26 ENCOUNTER — Ambulatory Visit (AMBULATORY_SURGERY_CENTER): Payer: 59 | Admitting: Gastroenterology

## 2019-05-26 VITALS — BP 130/67 | HR 69 | Temp 96.8°F | Resp 19 | Ht 67.0 in | Wt 210.0 lb

## 2019-05-26 DIAGNOSIS — Z1211 Encounter for screening for malignant neoplasm of colon: Secondary | ICD-10-CM

## 2019-05-26 MED ORDER — SODIUM CHLORIDE 0.9 % IV SOLN: 500.0000 mL | Freq: Once | INTRAVENOUS | Status: DC

## 2019-05-26 NOTE — Patient Instructions (Signed)
Thank you for allowing [place] to care for you today!  Resume previous diet and mediations today.  Start with small portions and increase as you tolerate.  Return to your normal activities tomorrow.  Recommend next screening colonoscopy in 10 years.  YOU HAD AN ENDOSCOPIC PROCEDURE TODAY AT [facility]:   Refer to the procedure report that was given to you for any specific questions about what was found during the examination.  If the procedure report does not answer your questions, please call your gastroenterologist to clarify.  If you requested that your care partner not be given the details of your procedure findings, then the procedure report has been included in a sealed envelope for you to review at your convenience later.  YOU SHOULD EXPECT: Some feelings of bloating in the abdomen. Passage of more gas than usual.  Walking can help get rid of the air that was put into your GI tract during the procedure and reduce the bloating. If you had a lower endoscopy (such as a colonoscopy or flexible sigmoidoscopy) you may notice spotting of blood in your stool or on the toilet paper. If you underwent a bowel prep for your procedure, you may not have a normal bowel movement for a few days.  Please Note:  You might notice some irritation and congestion in your nose or some drainage.  This is from the oxygen used during your procedure.  There is no need for concern and it should clear up in a day or so.  SYMPTOMS TO REPORT IMMEDIATELY:   Following lower endoscopy (colonoscopy or flexible sigmoidoscopy):  Excessive amounts of blood in the stool  Significant tenderness or worsening of abdominal pains  Swelling of the abdomen that is new, acute  Fever of 100F or higher   For urgent or emergent issues, a gastroenterologist can be reached at any hour by calling [phone]. Do not use MyChart messaging for urgent concerns.    DIET:  We do recommend a small meal at first, but then  you may proceed to your regular diet.  Drink plenty of fluids but you should avoid alcoholic beverages for 24 hours.  ACTIVITY:  You should plan to take it easy for the rest of today and you should NOT DRIVE or use heavy machinery until tomorrow (because of the sedation medicines used during the test).    FOLLOW UP: Our staff will call the number listed on your records 48-72 hours following your procedure to check on you and address any questions or concerns that you may have regarding the information given to you following your procedure. If we do not reach you, we will leave a message.  We will attempt to reach you two times.  During this call, we will ask if you have developed any symptoms of COVID 19. If you develop any symptoms (ie: fever, flu-like symptoms, shortness of breath, cough etc.) before then, please call [phone].  If you test positive for Covid 19 in the 2 weeks post procedure, please call and report this information to [place].    If any biopsies were taken you will be contacted by phone or by letter within the next 1-3 weeks.  Please call us at [phone] if you have not heard about the biopsies in 3 weeks.    SIGNATURES/CONFIDENTIALITY: You and/or your care partner have signed paperwork which will be entered into your electronic medical record.  These signatures attest to the fact that that the information above on your  After Visit Summary has been reviewed and is understood.  Full responsibility of the confidentiality of this discharge information lies with you and/or your care-partner.

## 2019-05-26 NOTE — Progress Notes (Signed)
Vitals-CA Temp-JB

## 2019-05-26 NOTE — Progress Notes (Signed)
Pt's states no medical or surgical changes since previsit or office visit.  KA vitals, LC temp and BC IV.

## 2019-05-26 NOTE — Op Note (Signed)
[facility] Patient Name: Andrea Lynn Procedure Date: 05/26/2019 9:31 AM MRN: [id number] Endoscopist: Mauri Pole , MD Age: 54 Referring MD:  Date of Birth: 12-Jan-1966 Gender: Female Account #: [id number] Procedure:                Colonoscopy Indications:              Screening for colorectal malignant neoplasm Medicines:                Monitored Anesthesia Care Procedure:                Pre-Anesthesia Assessment:                           - Prior to the procedure, a History and Physical                            was performed, and patient medications and                            allergies were reviewed. The patient's tolerance of                            previous anesthesia was also reviewed. The risks                            and benefits of the procedure and the sedation                            options and risks were discussed with the patient.                            All questions were answered, and informed consent                            was obtained. Prior Anticoagulants: The patient has                            taken no previous anticoagulant or antiplatelet                            agents. ASA Grade Assessment: II - A patient with                            mild systemic disease. After reviewing the risks                            and benefits, the patient was deemed in                            satisfactory condition to undergo the procedure.                           After obtaining informed consent, the colonoscope  was passed under direct vision. Throughout the                            procedure, the patient's blood pressure, pulse, and                            oxygen saturations were monitored continuously. The                            Colonoscope was introduced through the anus and                            advanced to the the cecum, identified by                            appendiceal orifice  and ileocecal valve. The                            colonoscopy was performed without difficulty. The                            patient tolerated the procedure well. The quality                            of the bowel preparation was excellent. The                            ileocecal valve, appendiceal orifice, and rectum                            were photographed. Scope In: 9:40:35 AM Scope Out: 9:57:21 AM Scope Withdrawal Time: 0 hours 10 minutes 25 seconds  Total Procedure Duration: 0 hours 16 minutes 46 seconds  Findings:                 The perianal and digital rectal examinations were                            normal.                           Non-bleeding internal hemorrhoids were found during                            retroflexion. The hemorrhoids were small.                           The exam was otherwise without abnormality. Complications:            No immediate complications. Estimated Blood Loss:     Estimated blood loss was minimal. Impression:               - Non-bleeding internal hemorrhoids.                           - The examination was otherwise normal.                           -  No specimens collected. Recommendation:           - Patient has a contact number available for                            emergencies. The signs and symptoms of potential                            delayed complications were discussed with the                            patient. Return to normal activities tomorrow.                            Written discharge instructions were provided to the                            patient.                           - Resume previous diet.                           - Continue present medications.                           - Await pathology results.                           - Repeat colonoscopy in 10 years for screening                            purposes. Mauri Pole, MD 05/26/2019 10:01:12 AM This report has been signed  electronically.

## 2019-05-26 NOTE — Progress Notes (Signed)
A/ox3, pleased with MAC, report to RN 

## 2019-05-30 ENCOUNTER — Telehealth: Payer: Self-pay | Admitting: *Deleted

## 2019-05-30 NOTE — Telephone Encounter (Signed)
Left message on f/u call 

## 2019-06-22 ENCOUNTER — Other Ambulatory Visit (HOSPITAL_COMMUNITY): Payer: Self-pay | Admitting: Internal Medicine

## 2019-06-22 MED FILL — ROSUVASTATIN CALCIUM 10 MG: 10 | 90 days supply | Qty: 90 | Fill #0

## 2019-06-22 MED FILL — CYCLOBENZAPRINE HCL 5 MG TA: 5 | 10 days supply | Qty: 30 | Fill #0

## 2019-06-22 MED FILL — IRBESARTAN 75 MG TABS: 75 | 90 days supply | Qty: 90 | Fill #0

## 2019-06-23 MED FILL — DICLOFENAC SODIUM 1 % GEL: 1 | 3 days supply | Qty: 100 | Fill #0

## 2019-08-12 ENCOUNTER — Other Ambulatory Visit (HOSPITAL_COMMUNITY): Payer: Self-pay | Admitting: Internal Medicine

## 2019-08-12 MED FILL — CYCLOBENZAPRINE HCL 5 MG TA: 5 | 30 days supply | Qty: 90 | Fill #0

## 2019-08-24 MED FILL — DICLOFENAC SODIUM 1 % GEL: 1 | 6 days supply | Qty: 100 | Fill #0

## 2019-10-05 MED FILL — IRBESARTAN 75 MG TABS: 75 | 90 days supply | Qty: 90 | Fill #1

## 2019-10-05 MED FILL — ROSUVASTATIN CALCIUM 10 MG: 10 | 90 days supply | Qty: 90 | Fill #1

## 2019-12-27 ENCOUNTER — Other Ambulatory Visit (HOSPITAL_COMMUNITY): Payer: Self-pay | Admitting: Orthopedic Surgery

## 2019-12-27 MED FILL — ROSUVASTATIN CALCIUM 10 MG: 10 | 90 days supply | Qty: 90 | Fill #2

## 2019-12-27 MED FILL — IRBESARTAN 75 MG TABS: 75 | 90 days supply | Qty: 90 | Fill #2

## 2019-12-27 MED FILL — NAPROXEN 500 MG TABS: 500 | 30 days supply | Qty: 60 | Fill #0

## 2020-04-27 MED FILL — ROSUVASTATIN CALCIUM 10 MG: 10 | 90 days supply | Qty: 90 | Fill #3

## 2020-04-27 MED FILL — IRBESARTAN 75 MG TABS: 75 | 90 days supply | Qty: 90 | Fill #3

## 2020-04-27 MED FILL — NAPROXEN 500 MG TABS: 500 | 30 days supply | Qty: 60 | Fill #1

## 2020-04-27 MED FILL — CYCLOBENZAPRINE HCL 5 MG TA: 5 | 30 days supply | Qty: 90 | Fill #1

## 2020-05-08 ENCOUNTER — Other Ambulatory Visit (HOSPITAL_BASED_OUTPATIENT_CLINIC_OR_DEPARTMENT_OTHER): Payer: Self-pay

## 2020-05-15 ENCOUNTER — Other Ambulatory Visit (HOSPITAL_BASED_OUTPATIENT_CLINIC_OR_DEPARTMENT_OTHER): Payer: Self-pay

## 2020-05-29 ENCOUNTER — Other Ambulatory Visit (HOSPITAL_COMMUNITY): Payer: Self-pay

## 2020-05-29 MED ORDER — AMLODIPINE BESYLATE 5 MG PO TABS
ORAL_TABLET | ORAL | 3 refills | Status: AC
  Filled 2020-05-29: qty 30, 30d supply, fill #0
  Filled 2020-07-02: qty 30, 30d supply, fill #1

## 2020-06-06 ENCOUNTER — Other Ambulatory Visit (HOSPITAL_COMMUNITY): Payer: Self-pay

## 2020-06-06 MED ORDER — IRBESARTAN 150 MG PO TABS
ORAL_TABLET | ORAL | 1 refills | Status: AC
  Filled 2020-06-06: qty 90, 90d supply, fill #0

## 2020-06-07 ENCOUNTER — Other Ambulatory Visit (HOSPITAL_COMMUNITY): Payer: Self-pay

## 2020-07-02 ENCOUNTER — Other Ambulatory Visit (HOSPITAL_COMMUNITY): Payer: Self-pay

## 2020-07-31 ENCOUNTER — Other Ambulatory Visit (HOSPITAL_COMMUNITY): Payer: Self-pay

## 2020-08-01 ENCOUNTER — Other Ambulatory Visit (HOSPITAL_COMMUNITY): Payer: Self-pay

## 2020-08-06 ENCOUNTER — Other Ambulatory Visit (HOSPITAL_COMMUNITY): Payer: Self-pay

## 2020-08-08 ENCOUNTER — Other Ambulatory Visit (HOSPITAL_COMMUNITY): Payer: Self-pay

## 2020-08-09 ENCOUNTER — Other Ambulatory Visit (HOSPITAL_COMMUNITY): Payer: Self-pay

## 2020-08-10 ENCOUNTER — Other Ambulatory Visit (HOSPITAL_COMMUNITY): Payer: Self-pay

## 2020-08-13 ENCOUNTER — Other Ambulatory Visit (HOSPITAL_COMMUNITY): Payer: Self-pay

## 2020-08-15 ENCOUNTER — Other Ambulatory Visit (HOSPITAL_COMMUNITY): Payer: Self-pay

## 2020-08-15 MED ORDER — ROSUVASTATIN CALCIUM 10 MG PO TABS
ORAL_TABLET | ORAL | 1 refills | Status: AC
  Filled 2020-08-15: qty 90, 90d supply, fill #0
  Filled 2020-12-13: qty 90, 90d supply, fill #1

## 2020-09-11 ENCOUNTER — Other Ambulatory Visit (HOSPITAL_COMMUNITY): Payer: Self-pay

## 2020-09-11 MED ORDER — AMLODIPINE BESYLATE 5 MG PO TABS
ORAL_TABLET | ORAL | 3 refills | Status: DC
  Filled 2020-09-11: qty 90, 90d supply, fill #0
  Filled 2020-12-13: qty 90, 90d supply, fill #1
  Filled 2021-03-25: qty 90, 90d supply, fill #2
  Filled 2021-07-20: qty 90, 90d supply, fill #3

## 2020-09-11 MED ORDER — IRBESARTAN 150 MG PO TABS
ORAL_TABLET | ORAL | 3 refills | Status: DC
  Filled 2020-09-11: qty 90, 90d supply, fill #0
  Filled 2020-12-13: qty 90, 90d supply, fill #1
  Filled 2021-03-25: qty 90, 90d supply, fill #2
  Filled 2021-07-20: qty 90, 90d supply, fill #3

## 2020-09-11 MED ORDER — ROSUVASTATIN CALCIUM 10 MG PO TABS
ORAL_TABLET | ORAL | 3 refills | Status: DC
  Filled 2020-09-11 – 2021-03-25 (×2): qty 90, 90d supply, fill #0
  Filled 2021-07-20: qty 90, 90d supply, fill #1

## 2020-10-18 ENCOUNTER — Other Ambulatory Visit (HOSPITAL_COMMUNITY): Payer: Self-pay

## 2020-11-03 ENCOUNTER — Other Ambulatory Visit (HOSPITAL_COMMUNITY): Payer: Self-pay

## 2020-11-05 ENCOUNTER — Other Ambulatory Visit (HOSPITAL_COMMUNITY): Payer: Self-pay

## 2020-11-05 MED ORDER — CYCLOBENZAPRINE HCL 5 MG PO TABS
5.0000 mg | ORAL_TABLET | Freq: Three times a day (TID) | ORAL | 3 refills | Status: AC | PRN
  Filled 2020-11-05: qty 90, 30d supply, fill #0
  Filled 2021-03-25: qty 90, 30d supply, fill #1

## 2020-12-14 ENCOUNTER — Other Ambulatory Visit (HOSPITAL_COMMUNITY): Payer: Self-pay

## 2021-03-25 ENCOUNTER — Other Ambulatory Visit (HOSPITAL_COMMUNITY): Payer: Self-pay

## 2021-03-26 ENCOUNTER — Other Ambulatory Visit (HOSPITAL_COMMUNITY): Payer: Self-pay

## 2021-04-18 ENCOUNTER — Ambulatory Visit: Payer: Self-pay | Admitting: Nurse Practitioner

## 2021-06-11 ENCOUNTER — Other Ambulatory Visit (HOSPITAL_COMMUNITY): Payer: Self-pay

## 2021-06-11 MED ORDER — TINIDAZOLE 500 MG PO TABS
ORAL_TABLET | ORAL | 1 refills | Status: AC
  Filled 2021-06-11: qty 8, 2d supply, fill #0
  Filled 2021-08-08: qty 8, 2d supply, fill #1

## 2021-07-20 ENCOUNTER — Other Ambulatory Visit (HOSPITAL_COMMUNITY): Payer: Self-pay

## 2021-08-08 ENCOUNTER — Other Ambulatory Visit (HOSPITAL_COMMUNITY): Payer: Self-pay

## 2021-10-03 ENCOUNTER — Other Ambulatory Visit (HOSPITAL_COMMUNITY): Payer: Self-pay

## 2021-10-03 MED ORDER — DICLOFENAC SODIUM 1 % EX GEL
CUTANEOUS | 3 refills | Status: AC
  Filled 2021-10-03: qty 100, 90d supply, fill #0

## 2021-10-03 MED ORDER — IRBESARTAN 150 MG PO TABS
ORAL_TABLET | ORAL | 3 refills | Status: DC
  Filled 2021-10-03: qty 90, 90d supply, fill #0
  Filled 2022-02-12: qty 90, 90d supply, fill #1
  Filled 2022-06-03: qty 90, 90d supply, fill #2

## 2021-10-03 MED ORDER — AMLODIPINE BESYLATE 5 MG PO TABS
ORAL_TABLET | ORAL | 3 refills | Status: DC
  Filled 2021-10-03: qty 90, 90d supply, fill #0
  Filled 2022-02-12: qty 90, 90d supply, fill #1
  Filled 2022-06-03: qty 90, 90d supply, fill #2

## 2021-10-03 MED ORDER — ROSUVASTATIN CALCIUM 10 MG PO TABS
ORAL_TABLET | ORAL | 3 refills | Status: DC
  Filled 2021-10-03: qty 90, 90d supply, fill #0
  Filled 2022-02-12: qty 90, 90d supply, fill #1
  Filled 2022-06-03: qty 90, 90d supply, fill #2
  Filled 2022-09-08 (×2): qty 90, 90d supply, fill #3

## 2021-10-03 MED ORDER — CYCLOBENZAPRINE HCL 5 MG PO TABS
ORAL_TABLET | ORAL | 3 refills | Status: DC
  Filled 2021-10-03: qty 90, 90d supply, fill #0
  Filled 2022-02-12: qty 90, 90d supply, fill #1
  Filled 2022-06-03: qty 30, 30d supply, fill #2

## 2021-10-04 ENCOUNTER — Other Ambulatory Visit (HOSPITAL_COMMUNITY): Payer: Self-pay

## 2021-10-09 ENCOUNTER — Other Ambulatory Visit (HOSPITAL_COMMUNITY): Payer: Self-pay

## 2021-10-09 MED ORDER — NITROFURANTOIN MONOHYD MACRO 100 MG PO CAPS
ORAL_CAPSULE | ORAL | 0 refills | Status: AC
  Filled 2021-10-09: qty 14, 7d supply, fill #0

## 2022-02-12 ENCOUNTER — Other Ambulatory Visit (HOSPITAL_COMMUNITY): Payer: Self-pay

## 2022-06-03 ENCOUNTER — Other Ambulatory Visit: Payer: Self-pay

## 2022-07-30 ENCOUNTER — Other Ambulatory Visit (HOSPITAL_COMMUNITY): Payer: Self-pay

## 2022-09-08 ENCOUNTER — Other Ambulatory Visit (HOSPITAL_COMMUNITY): Payer: Self-pay

## 2022-11-05 ENCOUNTER — Other Ambulatory Visit (HOSPITAL_COMMUNITY): Payer: Self-pay

## 2022-11-05 ENCOUNTER — Other Ambulatory Visit: Payer: Self-pay

## 2022-11-05 MED ORDER — CYCLOBENZAPRINE HCL 5 MG PO TABS
5.0000 mg | ORAL_TABLET | Freq: Every evening | ORAL | 3 refills | Status: DC | PRN
  Filled 2022-11-05: qty 34, 34d supply, fill #0
  Filled 2023-01-28: qty 34, 34d supply, fill #1
  Filled 2023-04-15: qty 34, 34d supply, fill #2
  Filled 2023-06-06: qty 34, 34d supply, fill #3

## 2022-11-05 MED ORDER — IRBESARTAN 150 MG PO TABS
150.0000 mg | ORAL_TABLET | Freq: Every day | ORAL | 3 refills | Status: DC
  Filled 2022-11-05: qty 90, 90d supply, fill #0
  Filled 2023-01-28: qty 90, 90d supply, fill #1
  Filled 2023-06-06: qty 90, 90d supply, fill #2
  Filled 2023-09-03 (×2): qty 90, 90d supply, fill #3

## 2022-11-05 MED ORDER — AMLODIPINE BESYLATE 5 MG PO TABS
5.0000 mg | ORAL_TABLET | Freq: Every day | ORAL | 3 refills | Status: DC
  Filled 2022-11-05: qty 90, 90d supply, fill #0
  Filled 2023-01-28: qty 90, 90d supply, fill #1
  Filled 2023-04-15: qty 90, 90d supply, fill #2
  Filled 2023-08-03: qty 90, 90d supply, fill #3

## 2022-12-02 ENCOUNTER — Other Ambulatory Visit (HOSPITAL_COMMUNITY): Payer: Self-pay

## 2022-12-03 ENCOUNTER — Other Ambulatory Visit: Payer: Self-pay

## 2022-12-03 ENCOUNTER — Other Ambulatory Visit (HOSPITAL_COMMUNITY): Payer: Self-pay

## 2022-12-03 MED ORDER — ROSUVASTATIN CALCIUM 10 MG PO TABS
10.0000 mg | ORAL_TABLET | Freq: Every day | ORAL | 3 refills | Status: DC
  Filled 2022-12-03: qty 90, 90d supply, fill #0
  Filled 2023-04-15: qty 90, 90d supply, fill #1
  Filled 2023-08-03: qty 90, 90d supply, fill #2
  Filled 2023-11-04: qty 90, 90d supply, fill #3

## 2023-01-28 ENCOUNTER — Other Ambulatory Visit (HOSPITAL_COMMUNITY): Payer: Self-pay

## 2023-04-15 ENCOUNTER — Other Ambulatory Visit (HOSPITAL_COMMUNITY): Payer: Self-pay

## 2023-06-06 ENCOUNTER — Other Ambulatory Visit (HOSPITAL_COMMUNITY): Payer: Self-pay

## 2023-08-03 ENCOUNTER — Other Ambulatory Visit (HOSPITAL_COMMUNITY): Payer: Self-pay

## 2023-08-03 MED ORDER — AMLODIPINE BESYLATE 5 MG PO TABS
5.0000 mg | ORAL_TABLET | Freq: Every day | ORAL | 3 refills | Status: AC
  Filled 2023-11-04: qty 90, 90d supply, fill #0
  Filled 2024-01-26: qty 90, 90d supply, fill #1
  Filled 2024-02-28: qty 90, 90d supply, fill #2

## 2023-09-03 ENCOUNTER — Other Ambulatory Visit (HOSPITAL_COMMUNITY): Payer: Self-pay

## 2023-11-04 ENCOUNTER — Other Ambulatory Visit (HOSPITAL_COMMUNITY): Payer: Self-pay

## 2023-11-20 ENCOUNTER — Other Ambulatory Visit: Payer: Self-pay

## 2023-11-20 ENCOUNTER — Other Ambulatory Visit (HOSPITAL_COMMUNITY): Payer: Self-pay

## 2023-11-20 MED ORDER — IRBESARTAN 150 MG PO TABS
150.0000 mg | ORAL_TABLET | Freq: Every day | ORAL | 3 refills | Status: AC
  Filled 2023-11-20: qty 90, 90d supply, fill #0
  Filled 2024-02-28: qty 90, 90d supply, fill #1

## 2024-01-26 ENCOUNTER — Other Ambulatory Visit (HOSPITAL_COMMUNITY): Payer: Self-pay

## 2024-02-28 ENCOUNTER — Other Ambulatory Visit (HOSPITAL_COMMUNITY): Payer: Self-pay

## 2024-02-29 ENCOUNTER — Other Ambulatory Visit (HOSPITAL_COMMUNITY): Payer: Self-pay

## 2024-02-29 ENCOUNTER — Other Ambulatory Visit: Payer: Self-pay

## 2024-02-29 MED ORDER — ROSUVASTATIN CALCIUM 10 MG PO TABS
10.0000 mg | ORAL_TABLET | Freq: Every day | ORAL | 3 refills | Status: AC
  Filled 2024-02-29: qty 90, 90d supply, fill #0

## 2024-02-29 MED ORDER — CYCLOBENZAPRINE HCL 5 MG PO TABS
5.0000 mg | ORAL_TABLET | Freq: Every evening | ORAL | 3 refills | Status: AC | PRN
  Filled 2024-02-29: qty 30, 30d supply, fill #0
